# Patient Record
Sex: Male | Born: 1945 | Race: Black or African American | Hispanic: No | Marital: Single | Smoking: Never smoker
Health system: Southern US, Community
[De-identification: ages and names within clinical notes are randomized; demographics above are authoritative.]

## PROBLEM LIST (undated history)

## (undated) DIAGNOSIS — F79 Unspecified intellectual disabilities: Secondary | ICD-10-CM

## (undated) DIAGNOSIS — E119 Type 2 diabetes mellitus without complications: Secondary | ICD-10-CM

## (undated) DIAGNOSIS — I10 Essential (primary) hypertension: Secondary | ICD-10-CM

---

## 2018-07-03 ENCOUNTER — Emergency Department (HOSPITAL_BASED_OUTPATIENT_CLINIC_OR_DEPARTMENT_OTHER): Payer: Medicare Other

## 2018-07-03 ENCOUNTER — Other Ambulatory Visit: Payer: Self-pay

## 2018-07-03 ENCOUNTER — Emergency Department (HOSPITAL_BASED_OUTPATIENT_CLINIC_OR_DEPARTMENT_OTHER)
Admission: EM | Admit: 2018-07-03 | Discharge: 2018-07-03 | Disposition: A | Discharge status: Home / Self Care | Payer: Medicare Other | Attending: Emergency Medicine | Admitting: Emergency Medicine

## 2018-07-03 ENCOUNTER — Encounter (HOSPITAL_BASED_OUTPATIENT_CLINIC_OR_DEPARTMENT_OTHER): Payer: Self-pay

## 2018-07-03 DIAGNOSIS — Y9389 Activity, other specified: Secondary | ICD-10-CM | POA: Diagnosis not present

## 2018-07-03 DIAGNOSIS — E119 Type 2 diabetes mellitus without complications: Secondary | ICD-10-CM | POA: Diagnosis not present

## 2018-07-03 DIAGNOSIS — Y999 Unspecified external cause status: Secondary | ICD-10-CM | POA: Diagnosis not present

## 2018-07-03 DIAGNOSIS — Y92003 Bedroom of unspecified non-institutional (private) residence as the place of occurrence of the external cause: Secondary | ICD-10-CM | POA: Diagnosis not present

## 2018-07-03 DIAGNOSIS — S60222A Contusion of left hand, initial encounter: Secondary | ICD-10-CM | POA: Diagnosis not present

## 2018-07-03 DIAGNOSIS — W06XXXA Fall from bed, initial encounter: Secondary | ICD-10-CM | POA: Diagnosis not present

## 2018-07-03 DIAGNOSIS — I10 Essential (primary) hypertension: Secondary | ICD-10-CM | POA: Insufficient documentation

## 2018-07-03 DIAGNOSIS — W19XXXA Unspecified fall, initial encounter: Secondary | ICD-10-CM

## 2018-07-03 DIAGNOSIS — S022XXA Fracture of nasal bones, initial encounter for closed fracture: Secondary | ICD-10-CM | POA: Diagnosis not present

## 2018-07-03 DIAGNOSIS — S0990XA Unspecified injury of head, initial encounter: Secondary | ICD-10-CM | POA: Diagnosis present

## 2018-07-03 HISTORY — DX: Essential (primary) hypertension: I10

## 2018-07-03 HISTORY — DX: Unspecified intellectual disabilities: F79

## 2018-07-03 HISTORY — DX: Type 2 diabetes mellitus without complications: E11.9

## 2018-07-03 NOTE — ED Provider Notes (Signed)
[facility] EMERGENCY DEPARTMENT Provider Note   CSN: [id number] Arrival date & time: 07/03/18  1220     History   Chief Complaint Chief Complaint  Patient presents with  . Fall    HPI Jermaine Nixon is a 73 y.o. male.     Patient with history of diabetes, hypertension presents the emergency department today after a fall approximately 2 AM.  Patient is ambulatory at baseline.  He lives at home with his sister.  He was getting out of his bed early this morning and family number heard him fall.  He was found to be sitting on his rear end with a nosebleed.  Nosebleed slowly stopped.  No preceding weakness or other recent health issues.  No blood thinners.  Patient has been ambulatory at his baseline prior to arrival today.  He sustained a small abrasion and some swelling over the bridge of the nose.  Also some swelling to his left posterior hand.  No other treatments prior to arrival.  Onset of symptoms acute.  Course is resolved.  Nothing makes symptoms better or worse.     Past Medical History:  Diagnosis Date  . Diabetes mellitus without complication ([place])   . Hypertension   . Mental disability     There are no active problems to display for this patient.   History reviewed. No pertinent surgical history.      Home Medications    Prior to Admission medications   Not on File    Family History No family history on file.  Social History Social History   Tobacco Use  . Smoking status: Never Smoker  . Smokeless tobacco: Never Used  Substance Use Topics  . Alcohol use: Never    Frequency: Never  . Drug use: Never     Allergies   Patient has no known allergies.   Review of Systems Review of Systems  Constitutional: Negative for fatigue.  HENT: Positive for facial swelling and nosebleeds. Negative for tinnitus.   Eyes: Negative for photophobia, pain and visual disturbance.  Respiratory: Negative for shortness of breath.   Cardiovascular: Negative  for chest pain.  Gastrointestinal: Negative for nausea and vomiting.  Musculoskeletal: Negative for back pain, gait problem and neck pain.  Skin: Negative for wound.  Neurological: Negative for dizziness, weakness, light-headedness, numbness and headaches.  Psychiatric/Behavioral: Negative for confusion and decreased concentration.     Physical Exam Updated Vital Signs BP 127/66 (BP Location: Right Arm)   Pulse 94   Temp 97.7 F (36.5 C) (Oral)   Resp 20   SpO2 100%   Physical Exam Vitals signs and nursing note reviewed.  Constitutional:      Appearance: He is well-developed.  HENT:     Head: Normocephalic. No raccoon eyes or Battle's sign.     Comments: There is a very small abrasion over the bridge of the nose with trace swelling over the bridge of the nose.    Right Ear: Tympanic membrane, ear canal and external ear normal. No hemotympanum.     Left Ear: Tympanic membrane, ear canal and external ear normal. No hemotympanum.     Nose: Nose normal.     Mouth/Throat:     Comments: Dentition intact. Eyes:     General: Lids are normal.     Conjunctiva/sclera: Conjunctivae normal.     Pupils: Pupils are equal, round, and reactive to light.     Comments: No visible hyphema  Neck:     Musculoskeletal: Normal range  of motion and neck supple.  Cardiovascular:     Rate and Rhythm: Normal rate and regular rhythm.  Pulmonary:     Effort: Pulmonary effort is normal.     Breath sounds: Normal breath sounds.  Abdominal:     Palpations: Abdomen is soft.     Tenderness: There is no abdominal tenderness.  Musculoskeletal: Normal range of motion.        General: Swelling present. No tenderness.     Cervical back: He exhibits normal range of motion, no tenderness and no bony tenderness.     Thoracic back: He exhibits no tenderness and no bony tenderness.     Lumbar back: He exhibits no tenderness and no bony tenderness.     Left hand: He exhibits swelling. He exhibits normal capillary  refill.       Hands:     Comments: Patient moves upper and lower extremities spontaneously without pain.  No tenderness with palpation over the C-spine.  Skin:    General: Skin is warm and dry.  Neurological:     Mental Status: He is alert.     GCS: GCS eye subscore is 4. GCS verbal subscore is 5. GCS motor subscore is 6.     Cranial Nerves: No cranial nerve deficit.     Sensory: No sensory deficit.     Coordination: Coordination normal.     Deep Tendon Reflexes: Reflexes are normal and symmetric.     Comments: Mental but disability at baseline.  Patient answers with questions yes and no.  Follows instructions well.      ED Treatments / Results  Labs (all labs ordered are listed, but only abnormal results are displayed) Labs Reviewed - No data to display  EKG None  Radiology No results found.  Procedures Procedures (including critical care time)  Medications Ordered in ED Medications - No data to display   Initial Impression / Assessment and Plan / ED Course  I have reviewed the triage vital signs and the nursing notes.  Pertinent labs & imaging results that were available during my care of the patient were reviewed by me and considered in my medical decision making (see chart for details).        Patient seen and examined.  Patient appears well.  No significant neurological decompensation after 11 hours.  Given his age and risk factors with head injury, will image the head, face, and neck.  Will also obtain x-ray of the hand.  Do not feel that a medical work-up is indicated as patient has been in his normal state of health without any unusual behavior weakness prior to fall this morning.  Vital signs reviewed and are as follows: BP 127/66 (BP Location: Right Arm)   Pulse 94   Temp 97.7 F (36.5 C) (Oral)   Resp 20   SpO2 100%   3:18 PM patient discussed with and seen by Dr. Pilar PlateBero.   Patient and family member updated on results.  Rechecked hand.  Patient has no  navicular or snuffbox tenderness.  Discussed conservative measures with OTC meds, ice, elevation for treatment.  Encouraged PCP follow-up if having any difficulty breathing through the nose after swelling around the nose improved.  Family member is comfortable with bringing patient home at this time.   Final Clinical Impressions(s) / ED Diagnoses   Final diagnoses:  Closed fracture of nasal bone, initial encounter  Contusion of left hand, initial encounter  Fall, initial encounter   Patient with injuries after mechanical  fall early this morning.  CT imaging reveals bony nasal bone fractures.  Suspect contusion of the hand without fracture.  No snuffbox tenderness.  Patient is otherwise in his baseline state of health.  No indications for further work-up at this time.  ED Discharge Orders    None       Renne CriglerGeiple, Aalina Brege, Cordelia Poche-C 07/03/18 1519    Sabas SousBero, Michael M, MD 07/05/18 [phone]

## 2018-07-03 NOTE — Discharge Instructions (Signed)
Please read and follow all provided instructions.  Your diagnoses today include:  1. Closed fracture of nasal bone, initial encounter   2. Contusion of left hand, initial encounter   3. Fall, initial encounter     Tests performed today include:  CT scan of your head, face and neck that showed nasal bone fractures but did not show any other serious injury.  X-ray of the hand - no definite broken bones  Vital signs. See below for your results today.   Medications prescribed:   None  Take any prescribed medications only as directed.  Home care instructions:  Follow any educational materials contained in this packet.  BE VERY CAREFUL not to take multiple medicines containing Tylenol (also called acetaminophen). Doing so can lead to an overdose which can damage your liver and cause liver failure and possibly death.   Follow-up instructions: Please follow-up with your primary care provider in the next 3 days for further evaluation of your symptoms if not feeling better.   Return instructions:  SEEK IMMEDIATE MEDICAL ATTENTION IF:  There is confusion or drowsiness (although children frequently become drowsy after injury).   You cannot awaken the injured person.   You have more than one episode of vomiting.   You notice dizziness or unsteadiness which is getting worse, or inability to walk.   You have convulsions or unconsciousness.   You experience severe, persistent headaches not relieved by Tylenol.  You cannot use arms or legs normally.   There are changes in pupil sizes. (This is the black center in the colored part of the eye)   There is clear or bloody discharge from the nose or ears.   You have change in speech, vision, swallowing, or understanding.   Localized weakness, numbness, tingling, or change in bowel or bladder control.  You have any other emergent concerns.  Additional Information: You have had a head injury which does not appear to require  admission at this time.  Your vital signs today were: BP 127/66 (BP Location: Right Arm)    Pulse 94    Temp 97.7 F (36.5 C) (Oral)    Resp 20    SpO2 100%  If your blood pressure (BP) was elevated above 135/85 this visit, please have this repeated by your doctor within one month. --------------

## 2018-07-03 NOTE — ED Triage Notes (Signed)
Per pt's sister pt fell ~2am-she states she heard a bang and found pt by his bed-pt mostly nonverbal-she states he had a nose bleed-unsure of other injuries-pt able to state he has pain to nose and left hand-to triage in w/c-NAD

## 2018-07-03 NOTE — ED Notes (Signed)
Pt to bathroom prior to room

## 2019-10-21 ENCOUNTER — Ambulatory Visit: Payer: Medicare Other | Attending: Internal Medicine

## 2019-10-21 DIAGNOSIS — Z23 Encounter for immunization: Secondary | ICD-10-CM

## 2019-10-21 NOTE — Progress Notes (Signed)
   Covid-19 Vaccination [facility]  Name:  Jermaine Nixon    MRN: [id number] DOB: 12/26/1945  10/21/2019  [name] was observed post Covid-19 immunization for 15 minutes without incident. He was provided with Vaccine Information Sheet and instruction to access the V-Safe system.   Jermaine Nixon was instructed to call 911 with any severe reactions post vaccine: Marland Kitchen Difficulty breathing  . Swelling of face and throat  . A fast heartbeat  . A bad rash all over body  . Dizziness and weakness

## 2020-06-25 ENCOUNTER — Other Ambulatory Visit: Payer: Self-pay

## 2020-06-25 ENCOUNTER — Emergency Department (HOSPITAL_BASED_OUTPATIENT_CLINIC_OR_DEPARTMENT_OTHER): Payer: Medicare Other

## 2020-06-25 ENCOUNTER — Emergency Department (HOSPITAL_BASED_OUTPATIENT_CLINIC_OR_DEPARTMENT_OTHER)
Admission: EM | Admit: 2020-06-25 | Discharge: 2020-06-25 | Disposition: A | Discharge status: Home / Self Care | Payer: Medicare Other | Attending: Emergency Medicine | Admitting: Emergency Medicine

## 2020-06-25 ENCOUNTER — Encounter (HOSPITAL_BASED_OUTPATIENT_CLINIC_OR_DEPARTMENT_OTHER): Payer: Self-pay | Admitting: *Deleted

## 2020-06-25 DIAGNOSIS — E119 Type 2 diabetes mellitus without complications: Secondary | ICD-10-CM | POA: Diagnosis not present

## 2020-06-25 DIAGNOSIS — W19XXXA Unspecified fall, initial encounter: Secondary | ICD-10-CM

## 2020-06-25 DIAGNOSIS — S62114A Nondisplaced fracture of triquetrum [cuneiform] bone, right wrist, initial encounter for closed fracture: Secondary | ICD-10-CM

## 2020-06-25 DIAGNOSIS — M25531 Pain in right wrist: Secondary | ICD-10-CM | POA: Diagnosis not present

## 2020-06-25 DIAGNOSIS — Z79899 Other long term (current) drug therapy: Secondary | ICD-10-CM | POA: Diagnosis not present

## 2020-06-25 DIAGNOSIS — I10 Essential (primary) hypertension: Secondary | ICD-10-CM | POA: Insufficient documentation

## 2020-06-25 DIAGNOSIS — S6991XA Unspecified injury of right wrist, hand and finger(s), initial encounter: Secondary | ICD-10-CM | POA: Diagnosis present

## 2020-06-25 DIAGNOSIS — Z7982 Long term (current) use of aspirin: Secondary | ICD-10-CM | POA: Insufficient documentation

## 2020-06-25 DIAGNOSIS — W06XXXA Fall from bed, initial encounter: Secondary | ICD-10-CM | POA: Insufficient documentation

## 2020-06-25 DIAGNOSIS — Z7984 Long term (current) use of oral hypoglycemic drugs: Secondary | ICD-10-CM | POA: Insufficient documentation

## 2020-06-25 NOTE — Discharge Instructions (Addendum)
Imaging reveals you have a small fracture in one of the bones of your wrist.  I have placed you in a splint please keep on.  Do not get it wet as this is not waterproof.  I recommend over-the-counter pain medications like ibuprofen and or Tylenol every 6 hours as needed please follow dosing on the back of bottle.  I want you to follow-up with hand surgery for further evaluation given the contact information above please call  Come back to the emergency department if you develop chest pain, shortness of breath, severe abdominal pain, uncontrolled nausea, vomiting, diarrhea.

## 2020-06-25 NOTE — ED Provider Notes (Signed)
[facility] EMERGENCY DEPARTMENT Provider Note   CSN: [id number] Arrival date & time: 06/25/20  1734     History Chief Complaint  Patient presents with   Jermaine Nixon is a 75 y.o. male.  HPI  Patient with significant medical history of diabetes, hypertension, mental disability presents with chief complaint of right wrist pain.  Patient states he was in his bed and slid out  falling onto his butt.  He denies hitting his head, losing conscious, is not on anticoagulant, states that he was able to get himself up off the floor, has no pain when he ambulates.  States that he developed some wrist pain earlier today, states that his right wrist hurts when he moves it, denies paresthesia or weakness in his lower extremities.  Patient denies neck pain, back pain, chest pain, abdominal pain, hip pain or leg pain.  Patient's sister was at bedside able to validate the story, states that he is only started complaint of pain today while she was changing him, she states that he has been ambulating without difficulty, has had no other complaints, states he has been acting his normal self.  Past Medical History:  Diagnosis Date   Diabetes mellitus without complication ([place])    Hypertension    Mental disability     There are no problems to display for this patient.   History reviewed. No pertinent surgical history.     No family history on file.  Social History   Tobacco Use   Smoking status: Never   Smokeless tobacco: Never  Vaping Use   Vaping Use: Never used  Substance Use Topics   Alcohol use: Never   Drug use: Never    Home Medications Prior to Admission medications   Medication Sig Start Date End Date Taking? Authorizing Provider  aspirin EC 81 MG tablet Take by mouth. 05/12/10  Yes [provider]  benazepril (LOTENSIN) 20 MG tablet Take by mouth. 05/12/10  Yes [provider]  glipiZIDE (GLUCOTROL XL) 10 MG 24 hr tablet Take by mouth. 05/12/10   Yes [provider]  hydrochlorothiazide (HYDRODIURIL) 25 MG tablet Take by mouth. 05/12/10  Yes [provider]  metFORMIN (GLUCOPHAGE) 500 MG tablet Take by mouth. 05/12/10  Yes [provider]  pioglitazone (ACTOS) 45 MG tablet Take by mouth. 05/12/10  Yes [provider]  Assure Comfort Lancets 28G MISC Frequency:   Dosage:0     Instructions:  Note:check glucose daily 10/22/09   [provider]  Blood Glucose Monitoring Suppl (FIFTY50 GLUCOSE METER 2.0) w/Device KIT Frequency:   Dosage:0     Instructions:  Note:check blood sugar daily 10/22/09   [provider]  glucose blood (PRODIGY NO CODING BLOOD GLUC) test strip Frequency:Daily   Dosage:1     Instructions:  Note:TEST ONCE DAILY. 10/22/09   [provider]    Allergies    Patient has no known allergies.  Review of Systems   Review of Systems  Constitutional:  Negative for chills and fever.  HENT:  Negative for congestion.   Respiratory:  Negative for shortness of breath.   Cardiovascular:  Negative for chest pain.  Gastrointestinal:  Negative for abdominal pain.  Genitourinary:  Negative for enuresis.  Musculoskeletal:  Negative for back pain.       Right wrist pain.  Skin:  Negative for rash.  Neurological:  Negative for dizziness.  Hematological:  Does not bruise/bleed easily.   Physical Exam Updated Vital Signs  BP 136/71 (BP Location: Left Arm)   Pulse 82   Temp (!) 97.5 F (36.4 C) (Oral)   Resp 14   Ht 6' (1.829 m)   Wt 59 kg   SpO2 96%   BMI 17.63 kg/m   Physical Exam Vitals and nursing note reviewed.  Constitutional:      General: He is not in acute distress.    Appearance: He is not ill-appearing.  HENT:     Head: Normocephalic and atraumatic.     Nose: No congestion.  Eyes:     Conjunctiva/sclera: Conjunctivae normal.  Cardiovascular:     Rate and Rhythm: Normal rate and regular rhythm.     Pulses: Normal pulses.     Heart sounds: No  murmur heard.   No friction rub. No gallop.     Comments: Chest was palpated was nontender to palpation. Pulmonary:     Effort: No respiratory distress.     Breath sounds: No wheezing, rhonchi or rales.  Abdominal:     Palpations: Abdomen is soft.     Tenderness: There is no abdominal tenderness. There is no right CVA tenderness or left CVA tenderness.  Musculoskeletal:     Comments: Spine was palpated was nontender to palpation, no step-off or deformities present.  Patient is moving all 4 extremities.  Lower extremities were palpated they are nontender to palpation, there is no noted internal or external rotation of the hips, no leg shortening present.    Patient's right wrist was visualized it was slightly erythematous and edematous on my exam, he was tender to palpation along patient's anatomical snuffbox, no gross deformities present.  Patient is able to move all fingers, wrist, elbow without difficulty.  Neurovascular fully intact.  Skin:    General: Skin is warm and dry.  Neurological:     Mental Status: He is alert.  Psychiatric:        Mood and Affect: Mood normal.    ED Results / Procedures / Treatments   Labs (all labs ordered are listed, but only abnormal results are displayed) Labs Reviewed - No data to display  EKG None  Radiology DG Wrist Complete Right  Result Date: 06/25/2020 CLINICAL DATA:  Right wrist pain after fall yesterday. EXAM: RIGHT WRIST - COMPLETE 3+ VIEW COMPARISON:  None. FINDINGS: Dorsal triquetral fracture with overlying soft tissue swelling. Suspected nondisplaced fracture through the tip of the radial styloid process. No additional fracture. No dislocation. 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 IMPRESSION: 1. Dorsal triquetral fracture. 2. Suspected nondisplaced fracture through the tip of the radial styloid process. Electronically Signed   By: Titus Dubin M.D.   On: 06/25/2020 18:39    Procedures Procedures   Medications Ordered in  ED Medications - No data to display  ED Course  I have reviewed the triage vital signs and the nursing notes.  Pertinent labs & imaging results that were available during my care of the patient were reviewed by me and considered in my medical decision making (see chart for details).    MDM Rules/Calculators/A&P                         Initial impression-patient presents with right wrist pain.  He is alert, does not appear in acute distress, vital signs reassuring.  Will obtain imaging for further evaluation.  Work-up-x-ray reveals dorsal triquetral fracture and possible nondisplaced fracture through the tip of the radial styloid process.  Reassessment-patient and sister updated on imaging,  they are in agreement with splinting and referral to hand surgeon further evaluation.  They have no other questions at this time.  Rule out-low suspicion for intracranial head bleed as patient is not on anticoagulant, he denies hitting his head, denies headaches, change in vision, paresthesias or weakness the upper or lower extremities.  Low suspicion for spinal cord abnormality or spinal fracture as spine was palpated was nontender to palpation, patient is able to move all 4 extremities.  Low suspicion for rib fracture or pneumothorax as lung sounds are clear bilaterally, ribs are nontender to palpation, will defer imaging at this time.  Low suspicion for intra-abdominal trauma as abdomen soft nontender to palpation.  Low suspicion for hip fracture as there is no internal or external rotation of the legs, no leg shortening, patient stable without difficulty.  Plan-  Right wrist pain-suspect pain secondary due to fractures, will place in a sugar-tong splint, recommend over-the-counter pain medications, follow-up with hand surgery for further evaluation.  Vital signs have remained stable, no indication for hospital admission.  Patient discussed with attending and they agreed with assessment and plan.  Patient  given at home care as well strict return precautions.  Patient verbalized that they understood agreed to said plan.   Final Clinical Impression(s) / ED Diagnoses Final diagnoses:  Fall, initial encounter  Closed nondisplaced fracture of triquetrum of right wrist, initial encounter    Rx / DC Orders ED Discharge Orders     None        Aron Baba 06/25/20 Leane Platt, MD 06/25/20 2325

## 2020-06-25 NOTE — ED Triage Notes (Signed)
Yesterday he slipped off the bed landing on his buttocks. Redness to his right wrist. Hx CP.

## 2021-02-09 ENCOUNTER — Encounter (HOSPITAL_BASED_OUTPATIENT_CLINIC_OR_DEPARTMENT_OTHER): Payer: Self-pay

## 2021-02-09 ENCOUNTER — Emergency Department (HOSPITAL_BASED_OUTPATIENT_CLINIC_OR_DEPARTMENT_OTHER): Payer: Medicare Other

## 2021-02-09 ENCOUNTER — Other Ambulatory Visit: Payer: Self-pay

## 2021-02-09 ENCOUNTER — Observation Stay (HOSPITAL_BASED_OUTPATIENT_CLINIC_OR_DEPARTMENT_OTHER)
Admission: EM | Admit: 2021-02-09 | Discharge: 2021-02-11 | Disposition: A | Discharge status: Home Health | Payer: Medicare Other | Attending: Internal Medicine | Admitting: Internal Medicine

## 2021-02-09 DIAGNOSIS — R627 Adult failure to thrive: Secondary | ICD-10-CM | POA: Diagnosis not present

## 2021-02-09 DIAGNOSIS — Z7982 Long term (current) use of aspirin: Secondary | ICD-10-CM | POA: Diagnosis not present

## 2021-02-09 DIAGNOSIS — E870 Hyperosmolality and hypernatremia: Secondary | ICD-10-CM | POA: Diagnosis present

## 2021-02-09 DIAGNOSIS — R531 Weakness: Secondary | ICD-10-CM | POA: Diagnosis present

## 2021-02-09 DIAGNOSIS — Z20822 Contact with and (suspected) exposure to covid-19: Secondary | ICD-10-CM | POA: Diagnosis not present

## 2021-02-09 DIAGNOSIS — E860 Dehydration: Secondary | ICD-10-CM | POA: Diagnosis present

## 2021-02-09 DIAGNOSIS — Z7984 Long term (current) use of oral hypoglycemic drugs: Secondary | ICD-10-CM | POA: Insufficient documentation

## 2021-02-09 DIAGNOSIS — I10 Essential (primary) hypertension: Secondary | ICD-10-CM | POA: Diagnosis present

## 2021-02-09 DIAGNOSIS — N179 Acute kidney failure, unspecified: Secondary | ICD-10-CM | POA: Diagnosis present

## 2021-02-09 DIAGNOSIS — E785 Hyperlipidemia, unspecified: Secondary | ICD-10-CM | POA: Diagnosis present

## 2021-02-09 DIAGNOSIS — E119 Type 2 diabetes mellitus without complications: Secondary | ICD-10-CM | POA: Diagnosis not present

## 2021-02-09 DIAGNOSIS — E1122 Type 2 diabetes mellitus with diabetic chronic kidney disease: Secondary | ICD-10-CM

## 2021-02-09 DIAGNOSIS — Z79899 Other long term (current) drug therapy: Secondary | ICD-10-CM | POA: Diagnosis not present

## 2021-02-09 DIAGNOSIS — E872 Acidosis, unspecified: Principal | ICD-10-CM | POA: Diagnosis present

## 2021-02-09 LAB — COMPREHENSIVE METABOLIC PANEL
ALT: 21 U/L (ref 0–44)
AST: 28 U/L (ref 15–41)
Albumin: 4.5 g/dL (ref 3.5–5.0)
Alkaline Phosphatase: 69 U/L (ref 38–126)
Anion gap: 13 (ref 5–15)
BUN: 55 mg/dL — ABNORMAL HIGH (ref 8–23)
CO2: 26 mmol/L (ref 22–32)
Calcium: 10.7 mg/dL — ABNORMAL HIGH (ref 8.9–10.3)
Chloride: 109 mmol/L (ref 98–111)
Creatinine, Ser: 1.57 mg/dL — ABNORMAL HIGH (ref 0.61–1.24)
GFR, Estimated: 45 mL/min — ABNORMAL LOW (ref >60)
Glucose, Bld: 371 mg/dL — ABNORMAL HIGH (ref 70–99)
Potassium: 4.7 mmol/L (ref 3.5–5.1)
Sodium: 148 mmol/L — ABNORMAL HIGH (ref 135–145)
Total Bilirubin: 0.4 mg/dL (ref 0.3–1.2)
Total Protein: 8.6 g/dL — ABNORMAL HIGH (ref 6.5–8.1)

## 2021-02-09 LAB — CBC WITH DIFFERENTIAL/PLATELET
Abs Immature Granulocytes: 0.01 10*3/uL (ref 0.00–0.07)
Basophils Absolute: 0 10*3/uL (ref 0.0–0.1)
Basophils Relative: 0 %
Eosinophils Absolute: 0 10*3/uL (ref 0.0–0.5)
Eosinophils Relative: 0 %
HCT: 42.6 % (ref 39.0–52.0)
Hemoglobin: 13.5 g/dL (ref 13.0–17.0)
Immature Granulocytes: 0 %
Lymphocytes Relative: 20 %
Lymphs Abs: 1.6 10*3/uL (ref 0.7–4.0)
MCH: 30.5 pg (ref 26.0–34.0)
MCHC: 31.7 g/dL (ref 30.0–36.0)
MCV: 96.4 fL (ref 80.0–100.0)
Monocytes Absolute: 0.3 10*3/uL (ref 0.1–1.0)
Monocytes Relative: 4 %
Neutro Abs: 6 10*3/uL (ref 1.7–7.7)
Neutrophils Relative %: 76 %
Platelets: 248 10*3/uL (ref 150–400)
RBC: 4.42 MIL/uL (ref 4.22–5.81)
RDW: 16 % — ABNORMAL HIGH (ref 11.5–15.5)
WBC: 7.9 10*3/uL (ref 4.0–10.5)
nRBC: 0 % (ref 0.0–0.2)

## 2021-02-09 LAB — URINALYSIS, ROUTINE W REFLEX MICROSCOPIC
Bilirubin Urine: NEGATIVE
Glucose, UA: >=500 mg/dL — AB
Ketones, ur: 15 mg/dL — AB
Leukocytes,Ua: NEGATIVE
Nitrite: NEGATIVE
Protein, ur: NEGATIVE mg/dL
Specific Gravity, Urine: 1.01 (ref 1.005–1.030)
pH: 5 (ref 5.0–8.0)

## 2021-02-09 LAB — RESP PANEL BY RT-PCR (FLU A&B, COVID) ARPGX2
Influenza A by PCR: NEGATIVE
Influenza B by PCR: NEGATIVE
SARS Coronavirus 2 by RT PCR: NEGATIVE

## 2021-02-09 LAB — CBG MONITORING, ED: Glucose-Capillary: 125 mg/dL — ABNORMAL HIGH (ref 70–99)

## 2021-02-09 LAB — BASIC METABOLIC PANEL
Anion gap: 8 (ref 5–15)
BUN: 38 mg/dL — ABNORMAL HIGH (ref 8–23)
CO2: 26 mmol/L (ref 22–32)
Calcium: 9.4 mg/dL (ref 8.9–10.3)
Chloride: 113 mmol/L — ABNORMAL HIGH (ref 98–111)
Creatinine, Ser: 1.08 mg/dL (ref 0.61–1.24)
GFR, Estimated: >60 mL/min (ref >60)
Glucose, Bld: 127 mg/dL — ABNORMAL HIGH (ref 70–99)
Potassium: 3.6 mmol/L (ref 3.5–5.1)
Sodium: 147 mmol/L — ABNORMAL HIGH (ref 135–145)

## 2021-02-09 LAB — LACTIC ACID, PLASMA
Lactic Acid, Venous: 5.7 mmol/L (ref 0.5–1.9)
Lactic Acid, Venous: 4.8 mmol/L (ref 0.5–1.9)
Lactic Acid, Venous: 2 mmol/L (ref 0.5–1.9)

## 2021-02-09 LAB — SALICYLATE LEVEL: Salicylate Lvl: <7 mg/dL — ABNORMAL LOW (ref 7.0–30.0)

## 2021-02-09 LAB — URINALYSIS, MICROSCOPIC (REFLEX): WBC, UA: NONE SEEN WBC/hpf (ref 0–5)

## 2021-02-09 LAB — TSH: TSH: 1.81 u[IU]/mL (ref 0.350–4.500)

## 2021-02-09 LAB — CK: Total CK: 102 U/L (ref 49–397)

## 2021-02-09 LAB — AMMONIA: Ammonia: 26 umol/L (ref 9–35)

## 2021-02-09 LAB — GLUCOSE, CAPILLARY: Glucose-Capillary: 117 mg/dL — ABNORMAL HIGH (ref 70–99)

## 2021-02-09 MED ORDER — VANCOMYCIN HCL IN DEXTROSE 1-5 GM/200ML-% IV SOLN: 1000.0000 mg | INTRAVENOUS | Status: DC

## 2021-02-09 MED ORDER — PHENOL 1.4 % MT LIQD: 1.0000 | OROMUCOSAL | Status: DC | PRN

## 2021-02-09 MED ORDER — SALINE SPRAY 0.65 % NA SOLN: 1.0000 | NASAL | Status: DC | PRN

## 2021-02-09 MED ORDER — SODIUM CHLORIDE 0.9 % IV SOLN
2.0000 g | Freq: Once | INTRAVENOUS | Status: AC
  Administered 2021-02-09: 2 g via INTRAVENOUS
  Filled 2021-02-09: qty 2

## 2021-02-09 MED ORDER — FLEET ENEMA 7-19 GM/118ML RE ENEM
1.0000 | ENEMA | Freq: Once | RECTAL | Status: AC
  Administered 2021-02-10: 1 via RECTAL
  Filled 2021-02-09 (×2): qty 1

## 2021-02-09 MED ORDER — ALUM & MAG HYDROXIDE-SIMETH 200-200-20 MG/5ML PO SUSP: 30.0000 mL | ORAL | Status: DC | PRN

## 2021-02-09 MED ORDER — VANCOMYCIN HCL IN DEXTROSE 1-5 GM/200ML-% IV SOLN
1000.0000 mg | Freq: Once | INTRAVENOUS | Status: AC
  Administered 2021-02-09: 1000 mg via INTRAVENOUS
  Filled 2021-02-09: qty 200

## 2021-02-09 MED ORDER — POLYVINYL ALCOHOL 1.4 % OP SOLN: 1.0000 [drp] | OPHTHALMIC | Status: DC | PRN

## 2021-02-09 MED ORDER — METRONIDAZOLE 500 MG/100ML IV SOLN
500.0000 mg | Freq: Once | INTRAVENOUS | Status: AC
  Administered 2021-02-09: 500 mg via INTRAVENOUS
  Filled 2021-02-09: qty 100

## 2021-02-09 MED ORDER — SODIUM CHLORIDE 0.9 % IV SOLN: 2.0000 g | INTRAVENOUS | Status: DC

## 2021-02-09 MED ORDER — IOHEXOL 300 MG/ML  SOLN
80.0000 mL | Freq: Once | INTRAMUSCULAR | Status: AC | PRN
  Administered 2021-02-09: 80 mL via INTRAVENOUS

## 2021-02-09 MED ORDER — MUSCLE RUB 10-15 % EX CREA: 1.0000 "application " | TOPICAL_CREAM | CUTANEOUS | Status: DC | PRN

## 2021-02-09 MED ORDER — LACTATED RINGERS IV SOLN: INTRAVENOUS | Status: AC

## 2021-02-09 MED ORDER — ACETAMINOPHEN 650 MG RE SUPP: 650.0000 mg | Freq: Four times a day (QID) | RECTAL | Status: DC | PRN

## 2021-02-09 MED ORDER — INSULIN ASPART 100 UNIT/ML IJ SOLN
0.0000 [IU] | Freq: Three times a day (TID) | INTRAMUSCULAR | Status: DC
  Administered 2021-02-10: 2 [IU] via SUBCUTANEOUS
  Administered 2021-02-10: 1 [IU] via SUBCUTANEOUS
  Administered 2021-02-11: 2 [IU] via SUBCUTANEOUS

## 2021-02-09 MED ORDER — HYDROCORTISONE (PERIANAL) 2.5 % EX CREA: 1.0000 "application " | TOPICAL_CREAM | Freq: Four times a day (QID) | CUTANEOUS | Status: DC | PRN

## 2021-02-09 MED ORDER — LORATADINE 10 MG PO TABS: 10.0000 mg | ORAL_TABLET | Freq: Every day | ORAL | Status: DC | PRN

## 2021-02-09 MED ORDER — ACETAMINOPHEN 325 MG PO TABS: 650.0000 mg | ORAL_TABLET | Freq: Four times a day (QID) | ORAL | Status: DC | PRN

## 2021-02-09 MED ORDER — SODIUM CHLORIDE 0.9 % IV BOLUS
1000.0000 mL | Freq: Once | INTRAVENOUS | Status: AC
Start: 2021-02-09 — End: 2021-02-09
  Administered 2021-02-09: 1000 mL via INTRAVENOUS

## 2021-02-09 MED ORDER — LACTATED RINGERS IV BOLUS (SEPSIS)
1000.0000 mL | Freq: Once | INTRAVENOUS | Status: AC
  Administered 2021-02-09: 1000 mL via INTRAVENOUS

## 2021-02-09 MED ORDER — LIP MEDEX EX OINT: 1.0000 "application " | TOPICAL_OINTMENT | CUTANEOUS | Status: DC | PRN

## 2021-02-09 MED ORDER — GUAIFENESIN-DM 100-10 MG/5ML PO SYRP: 5.0000 mL | ORAL_SOLUTION | ORAL | Status: DC | PRN

## 2021-02-09 MED ORDER — HYDROCORTISONE 1 % EX CREA: 1.0000 "application " | TOPICAL_CREAM | Freq: Three times a day (TID) | CUTANEOUS | Status: DC | PRN

## 2021-02-09 NOTE — Sepsis Progress Note (Signed)
[facility] tracking the Code Sepsis. 

## 2021-02-09 NOTE — ED Notes (Signed)
Report given to [facility] 

## 2021-02-09 NOTE — ED Provider Notes (Signed)
[facility] EMERGENCY DEPARTMENT Provider Note   CSN: [id number] Arrival date & time: 02/09/21  1052     History  Chief Complaint  Patient presents with   Weakness    Jermaine Nixon is a 76 y.o. male.  76  yo M with a chief complaint of persistent dry mouth weakness going on for about a week now.  Patient was seen by his PCP and started on multiple medications.  Had 4 new medications started this week.  Jardiance, statin something for his urinary retention.   Weakness     Home Medications Prior to Admission medications   Medication Sig Start Date End Date Taking? Authorizing Provider  aspirin EC 81 MG tablet Take by mouth. 05/12/10   [provider]  Assure Comfort Lancets 28G MISC Frequency:   Dosage:0     Instructions:  Note:check glucose daily 10/22/09   [provider]  atorvastatin (LIPITOR) 40 MG tablet Take 40 mg by mouth daily. 01/28/21   [provider]  benazepril (LOTENSIN) 20 MG tablet Take by mouth. 05/12/10   [provider]  Blood Glucose Monitoring Suppl (FIFTY50 GLUCOSE METER 2.0) w/Device KIT Frequency:   Dosage:0     Instructions:  Note:check blood sugar daily 10/22/09   [provider]  glucose blood (PRODIGY NO CODING BLOOD GLUC) test strip Frequency:Daily   Dosage:1     Instructions:  Note:TEST ONCE DAILY. 10/22/09   [provider]  hydrochlorothiazide (HYDRODIURIL) 25 MG tablet Take by mouth. 05/12/10   [provider]  JARDIANCE 10 MG TABS tablet Take 10 mg by mouth daily. 01/28/21   [provider]  metFORMIN (GLUCOPHAGE) 500 MG tablet Take by mouth. 05/12/10   [provider]  pioglitazone (ACTOS) 45 MG tablet Take by mouth. 05/12/10   [provider]      Allergies    Patient has no known allergies.    Review of Systems   Review of Systems  Neurological:  Positive for weakness.   Physical Exam Updated Vital Signs BP 139/86    Pulse (!) 125    Temp 97.9  F (36.6 C)    Resp (!) 21    Ht 6' (1.829 m)    Wt 59.1 kg    SpO2 100%    BMI 17.66 kg/m  Physical Exam Vitals and nursing note reviewed.  Constitutional:      Appearance: He is well-developed.  HENT:     Head: Normocephalic and atraumatic.  Eyes:     Pupils: Pupils are equal, round, and reactive to light.  Neck:     Vascular: No JVD.  Cardiovascular:     Rate and Rhythm: Normal rate and regular rhythm.     Heart sounds: No murmur heard.   No friction rub. No gallop.  Pulmonary:     Effort: No respiratory distress.     Breath sounds: No wheezing.  Abdominal:     General: There is no distension.     Tenderness: There is no abdominal tenderness. There is no guarding or rebound.  Musculoskeletal:        General: Normal range of motion.     Cervical back: Normal range of motion and neck supple.  Skin:    Coloration: Skin is not pale.     Findings: No rash.  Neurological:     Mental Status: He is alert and oriented to person, place, and time.  Psychiatric:        Behavior: Behavior normal.  ED Results / Procedures / Treatments   Labs (all labs ordered are listed, but only abnormal results are displayed) Labs Reviewed  COMPREHENSIVE METABOLIC PANEL - Abnormal; Notable for the following components:      Result Value   Sodium 148 (*)    Glucose, Bld 371 (*)    BUN 55 (*)    Creatinine, Ser 1.57 (*)    Calcium 10.7 (*)    Total Protein 8.6 (*)    GFR, Estimated 45 (*)    All other components within normal limits  LACTIC ACID, PLASMA - Abnormal; Notable for the following components:   Lactic Acid, Venous 5.7 (*)    All other components within normal limits  CBC WITH DIFFERENTIAL/PLATELET - Abnormal; Notable for the following components:   RDW 16.0 (*)    All other components within normal limits  URINALYSIS, ROUTINE W REFLEX MICROSCOPIC - Abnormal; Notable for the following components:   Glucose, UA >=500 (*)    Hgb urine dipstick TRACE (*)    Ketones, ur 15 (*)     All other components within normal limits  URINALYSIS, MICROSCOPIC (REFLEX) - Abnormal; Notable for the following components:   Bacteria, UA RARE (*)    All other components within normal limits  CULTURE, BLOOD (ROUTINE X 2)  CULTURE, BLOOD (ROUTINE X 2)  RESP PANEL BY RT-PCR (FLU A&B, COVID) ARPGX2  AMMONIA  LACTIC ACID, PLASMA  LACTIC ACID, PLASMA  CBG MONITORING, ED    EKG EKG Interpretation  Date/Time:  Wednesday February 09 2021 11:56:59 EST Ventricular Rate:  102 PR Interval:  152 QRS Duration: 124 QT Interval:  343 QTC Calculation: 447 R Axis:   -51 Text Interpretation: Sinus tachycardia Right atrial enlargement Nonspecific IVCD with LAD Abnrm T, consider ischemia, anterolateral lds No old tracing to compare Confirmed by Deno Etienne [phone]) on 02/09/2021 12:06:46 PM  Radiology DG Chest 2 View  Result Date: 02/09/2021 CLINICAL DATA:  Altered level of consciousness. EXAM: CHEST - 2 VIEW COMPARISON:  None. FINDINGS: Heart size and vascularity normal. Lungs clear without infiltrate or effusion. No acute skeletal abnormality. IMPRESSION: No active cardiopulmonary disease. Electronically Signed   By: Franchot Gallo M.D.   On: 02/09/2021 12:04   CT Head Wo Contrast  Result Date: 02/09/2021 CLINICAL DATA:  Delirium. EXAM: CT HEAD WITHOUT CONTRAST TECHNIQUE: Contiguous axial images were obtained from the base of the skull through the vertex without intravenous contrast. RADIATION DOSE REDUCTION: This exam was performed according to the departmental dose-optimization program which includes automated exposure control, adjustment of the mA and/or kV according to patient size and/or use of iterative reconstruction technique. COMPARISON:  Prior head CT 07/03/2018. FINDINGS: Brain: Mildly motion degraded exam. Mild generalized cerebral atrophy.  Moderate cerebellar atrophy. 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 Background moderate patchy and ill-defined hypoattenuation within the cerebral white matter, nonspecific but compatible with chronic small vessel ischemic disease. Redemonstrated small chronic infarct within the inferior right cerebellar hemisphere. There is no acute intracranial hemorrhage. No demarcated cortical infarct. No extra-axial fluid collection. No evidence of an intracranial mass. No midline shift. Vascular: No hyperdense vessel. Atherosclerotic calcifications. Skull: Normal. Negative for fracture or focal lesion. Sinuses/Orbits: Visualized orbits show no acute finding. Mild mucosal thickening within the right maxillary sinus at the imaged levels. Mild mucosal thickening, and small mucous retention cyst, within the left maxillary sinus at the imaged levels. IMPRESSION: 1. Mildly motion degraded exam. 2. No evidence  of acute intracranial abnormality. 3. 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 4. Background moderate chronic small vessel ischemic changes within the cerebral white matter. 5. Redemonstrated small chronic infarct within the inferior right cerebellar hemisphere. 6. Mild generalized cerebral atrophy. 7. Moderate cerebellar atrophy. 8. Paranasal sinus disease at the imaged levels, as described. Electronically Signed   By: Kellie Simmering D.O.   On: 02/09/2021 14:52   CT ABDOMEN PELVIS W CONTRAST  Result Date: 02/09/2021 CLINICAL DATA:  Sepsis EXAM: CT ABDOMEN AND PELVIS WITH CONTRAST TECHNIQUE: Multidetector CT imaging of the abdomen and pelvis was performed using the standard protocol following bolus administration of intravenous contrast. RADIATION DOSE REDUCTION: This exam was performed according to the departmental dose-optimization program which includes automated exposure control, adjustment of the mA and/or kV according to patient size and/or use of iterative reconstruction technique. CONTRAST:   60m OMNIPAQUE IOHEXOL 300 MG/ML  SOLN COMPARISON:  None. FINDINGS: Lower chest: Basilar atelectasis.  Coronary artery calcifications. Hepatobiliary: No focal liver abnormality is seen. No gallstones, gallbladder wall thickening, or biliary dilatation. 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 No surrounding inflammatory change. No main pancreatic duct dilation. Spleen: Normal in size without focal abnormality. Adrenals/Urinary Tract: Bilateral adrenal glands are unremarkable. No hydronephrosis or nephrolithiasis. Scarring of the upper pole of the left kidney. Bladder is unremarkable. Stomach/Bowel: Stomach is within normal limits. Appendix is not visualized due to crowding of the bowel loops related to paucity of intra-fat. No secondary findings of acute appendicitis. No evidence of bowel wall thickening, distention, or inflammatory changes. Rectal stool ball measuring 6.5 cm with rectal wall thickening. Vascular/Lymphatic: Aortic atherosclerosis. No enlarged abdominal or pelvic lymph nodes. Reproductive: Mild prostatomegaly. Other: No intra-abdominal ascites or free air. No intra-abdominal fluid collections. Musculoskeletal: No acute or significant osseous findings. IMPRESSION: 1. No findings in the abdomen or pelvis to explain sepsis. 2. Rectal stool ball with no rectal wall thickening. 3.  Aortic Atherosclerosis (ICD10-I70.0). Electronically Signed   By: LYetta GlassmanM.D.   On: 02/09/2021 14:49    Procedures Procedures    Medications Ordered in ED Medications  lactated ringers infusion (has no administration in time range)  ceFEPIme (MAXIPIME) 2 g in sodium chloride 0.9 % 100 mL IVPB (2 g Intravenous New Bag/Given 02/09/21 1507)  metroNIDAZOLE (FLAGYL) IVPB 500 mg (500 mg Intravenous New Bag/Given 02/09/21 1514)  vancomycin (VANCOCIN) IVPB 1000 mg/200 mL premix (1,000 mg Intravenous New Bag/Given 02/09/21 1450)  lactated ringers bolus 1,000 mL (1,000 mLs  Intravenous New Bag/Given 02/09/21 1443)  ceFEPIme (MAXIPIME) 2 g in sodium chloride 0.9 % 100 mL IVPB (has no administration in time range)  vancomycin (VANCOCIN) IVPB 1000 mg/200 mL premix (has no administration in time range)  sodium phosphate (FLEET) 7-19 GM/118ML enema 1 enema (has no administration in time range)  sodium chloride 0.9 % bolus 1,000 mL (0 mLs Intravenous Stopped 02/09/21 1256)  iohexol (OMNIPAQUE) 300 MG/ML solution 80 mL (80 mLs Intravenous Contrast Given 02/09/21 1424)    ED Course/ Medical Decision Making/ A&P                           Medical Decision Making Amount and/or Complexity of Data Reviewed Labs: ordered. Radiology: ordered.  Risk OTC drugs. Prescription drug management. Decision regarding hospitalization.   Patient is a 76y.o. male with a cc of ams.  Going on for the past  couple days to a week.  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  Tells me that he had 4 different medications changed at their last doctors visit about a week ago.  Since then has developed a dry mouth and has been a bit more weak than normal.  On my review of the records the most recent visit I can see he was mostly bedbound.  The sister tells me he is much better since then.  Has been able to ambulate short distances usually with assistance.  Obtain a laboratory evaluation and chest x-ray blood work bolus of IV fluids UA bladder scan reassess..   I independently interpreted the patients labs and imaging chest x-ray independently interpreted by me without focal infiltrate or pneumothorax.  His lactic acid level is significantly elevated at 5.7.  UA interpreted by me without obvious infection.  Ammonia levels normal.  CT scan of the head negative.  CT of the abdomen pelvis without obvious acute pathology.  I reviewed the patient's recent medication changes and drug database and do not see lactic acidosis is a common finding for these.  Patient  could be dehydrated.  He was given 30 cc/kg of IV fluids and was given antibiotics for possible infection as history is somewhat limited.  Discussed with the hospitalist for admission.  CRITICAL CARE Performed by: Cecilio Asper   Total critical care time: 35 minutes  Critical care time was exclusive of separately billable procedures and treating other patients.  Critical care was necessary to treat or prevent imminent or life-threatening deterioration.  Critical care was time spent personally by me on the following activities: development of treatment plan with patient and/or surrogate as well as nursing, discussions with consultants, evaluation of patient's response to treatment, examination of patient, obtaining history from patient or surrogate, ordering and performing treatments and interventions, ordering and review of laboratory studies, ordering and review of radiographic studies, pulse oximetry and re-evaluation of patient's condition.  The patients results and plan were reviewed and discussed.   Any x-rays performed were independently reviewed by myself.   Differential diagnosis were considered with the presenting HPI.  Medications  lactated ringers infusion (has no administration in time range)  ceFEPIme (MAXIPIME) 2 g in sodium chloride 0.9 % 100 mL IVPB (2 g Intravenous New Bag/Given 02/09/21 1507)  metroNIDAZOLE (FLAGYL) IVPB 500 mg (500 mg Intravenous New Bag/Given 02/09/21 1514)  vancomycin (VANCOCIN) IVPB 1000 mg/200 mL premix (1,000 mg Intravenous New Bag/Given 02/09/21 1450)  lactated ringers bolus 1,000 mL (1,000 mLs Intravenous New Bag/Given 02/09/21 1443)  ceFEPIme (MAXIPIME) 2 g in sodium chloride 0.9 % 100 mL IVPB (has no administration in time range)  vancomycin (VANCOCIN) IVPB 1000 mg/200 mL premix (has no administration in time range)  sodium phosphate (FLEET) 7-19 GM/118ML enema 1 enema (has no administration in time range)  sodium chloride 0.9 % bolus 1,000 mL  (0 mLs Intravenous Stopped 02/09/21 1256)  iohexol (OMNIPAQUE) 300 MG/ML solution 80 mL (80 mLs Intravenous Contrast Given 02/09/21 1424)    Vitals:   02/09/21 1300 02/09/21 1400 02/09/21 1454 02/09/21 1500  BP: 134/71 131/68  139/86  Pulse: 96 (!) 101  (!) 125  Resp: 18 16  (!) 21  Temp:      SpO2: 100% 100%  100%  Weight:   59.1 kg   Height:   6' (1.829 m)     Final diagnoses:  Lactic acidemia  Weakness  Admission/ observation were discussed with the admitting physician, patient and/or family and they are comfortable with the plan.           Final Clinical Impression(s) / ED Diagnoses Final diagnoses:  Lactic acidemia  Weakness    Rx / DC Orders ED Discharge Orders     None         Deno Etienne, DO 02/09/21 1531

## 2021-02-09 NOTE — Sepsis Progress Note (Signed)
Notified bedside nurse of need to administer antibiotics, finish IV Fluid Resuscitation and collect a repeat Lactate after the IVF bolus is complete.

## 2021-02-09 NOTE — Progress Notes (Signed)
Date and time results received: 02/09/21 2208 (use smartphrase ".now" to insert current time)  Test: Lactic Acid Critical Value: 2.0   Name of Provider Notified: Howerter, DO  Orders Received? Or Actions Taken?:  Awaiting response.

## 2021-02-09 NOTE — ED Triage Notes (Signed)
Sister brought pt in to evaluated for weakness and mouth irritation. Started on atorvatatin, jardience and medication for bladder control . Pt seen by PCP 01/25/21

## 2021-02-09 NOTE — Progress Notes (Signed)
Pt arrived to [id number]. Patient vitals and skin swarm completed with [place], [place]. No skin issues noted. Pt resting in bed with bed alarm on. Awaiting pt's sister's arrival.

## 2021-02-09 NOTE — Progress Notes (Signed)
Pharmacy Antibiotic Note  Jasun Gasparini is a 76 y.o. male admitted on 02/09/2021 presenting with generalized weakness, concern for sepsis.  Pharmacy has been consulted for vancomycin and cefepime dosing.  Plan: Vancomycin 1g IV q 36h (eAUC 533, Goal AUC 400-550, SCr 1.57) Cefepime 2g IV q 24h Monitor renal function, Cx and clinical progression to narrow Vancomycin levels as indicated  Weight: 56.7 kg (125 lb)  Temp (24hrs), Avg:97.9 F (36.6 C), Min:97.9 F (36.6 C), Max:97.9 F (36.6 C)  Recent Labs  Lab 02/09/21 1132 02/09/21 1329  WBC 7.9  --   CREATININE 1.57*  --   LATICACIDVEN  --  5.7*    CrCl cannot be calculated (Unknown ideal weight.).    No Known Allergies  Daylene Posey, PharmD Clinical Pharmacist ED Pharmacist Phone # [phone] 02/09/2021 2:21 PM

## 2021-02-09 NOTE — Progress Notes (Signed)
When attempting to give the patient sips of water, the patient had 3 episodes of choking. This RN decided to hold off on further food and drinks, until the patient could be adequately assessed.

## 2021-02-09 NOTE — H&P (Signed)
History and Physical    PLEASE NOTE THAT DRAGON DICTATION SOFTWARE WAS USED IN THE CONSTRUCTION OF THIS NOTE.   Jermaine Nixon GDJ:[id number] DOB: [birth year] DOA: 02/09/2021  PCP: Saintclair Halsted, FNP  Patient coming from: home   I have personally briefly reviewed patient's old medical records in [place]  Chief Complaint: Generalized weakness  HPI: Jermaine Nixon is a 76 y.o. male with medical history significant for cognitive delay, type 2 diabetes mellitus, essential retention, hyperlipidemia, who is admitted to [facility] on 02/09/2021 by way of transfer from [place] emergency department with lactic acidosis after presenting from home to the latter facility for evaluation of generalized weakness.   In the setting of the patient's underlying cognitive delay and associated limited ability to contribute to the history, the following history is obtained via the EDP and via chart review.  The patient reports 2 to 3 days of generalized weakness in the absence of any associated acute focal weakness, acute focal numbness, paresthesias, facial droop, slurred speech, expressive aphasia, acute change in vision, dysphagia, vertigo.  Denies any associated subjective fever, chills, rigors, or generalized myalgias.Denies dysuria, gross hematuria, or change in urinary urgency/frequency.  Denies any recent chest pain, diaphoresis, palpitations, shortness of breath, cough, abdominal pain.  Per chart review, there is documentation of multiple recently initiated medications on an outpatient basis by patient's PCP, although the specific nature of these recently initiated medications is not entirely clear to me at this time.  Per chart review, patient has a history of type 2 diabetes mellitus, on multiple oral hypoglycemic agents as an outpatient, in the absence of any exogenous insulin.  He also has a history of essential hypertension, for which she appears to be on benazepril and  HCTZ.  Additionally, per chart review, most recent prior serum creatinine data point appears to be 1.30 in January 2019.     [facility] ED Course:  Vital signs in the ED were notable for the following: Afebrile; initial heart rate 109, which decreased to 88 following interval administration of IV fluids, as further detailed below; blood pressure 121/60 - 139/86; respiratory rate 15-21, oxygen saturation 97 to 100% on room air.  Labs were notable for the following: CMP was notable for the following: Serum sodium of 148, which corrects to approximately 152 and taking into account concomitant hyperglycemia, potassium 4.7, bicarbonate 26, anion gap 13, creatinine 1.57, BUN to creatinine ratio 35, glucose 371, calcium 10.7, albumin 4.5, additional liver enzymes found to be within normal limits.  CBC notable for episode count 7900.  Initial lactate 5.7, with repeat value trending down to 4.8.  Urinalysis was notable for no white blood cells, leukocyte Estrace/nitrate negative, no red blood cells, and demonstrated 15 ketones.  COVID-19/influenza PCR negative, blood cultures x2 were collected prior to initiation of IV antibiotics.  Imaging and additional notable ED work-up: EKG, interpretation of which was limited by significant artifact, but within these confines, appears to demonstrate sinus tachycardia with heart rate 102, no evidence of T wave changes, and nonspecific less than 1 mm ST depression, limited to V4, without any evidence of ST elevation.  Chest x-ray showed no evidence of acute cardiopulmonary process, including no evidence of infiltrate, edema, effusion, or pneumothorax.  CT abdomen/pelvis showed no evidence of acute intra-abdominal/intrapelvic process.  CT head showed no evidence of acute intracranial process, including no evidence of intracranial hemorrhage.  While in the ED, the following were administered: Cefepime, Flagyl, IV vancomycin,  normal saline x1 L bolus, lactated  Ringer's x1 L bolus, followed by initiation of continuous LR at 150 cc/h.  Subsequently, the patient was transferred to [facility] for overnight observation to med telemetry for further evaluation and management of presenting lactic acidosis in setting of suspected dehydration, with corresponding hypernatremia, acute kidney injury, and hypercalcemia, after presenting to [place] for evaluation of generalized weakness.      Review of Systems: As per HPI otherwise 10 point review of systems negative.   Past Medical History:  Diagnosis Date   Diabetes mellitus without complication ([place])    Hypertension    Mental disability     History reviewed. No pertinent surgical history.  Social History:  reports that he has never smoked. He has never used smokeless tobacco. He reports that he does not drink alcohol and does not use drugs.   No Known Allergies  History reviewed. No pertinent family history.   Prior to Admission medications   Medication Sig Start Date End Date Taking? Authorizing Provider  aspirin EC 81 MG tablet Take by mouth. 05/12/10   [provider]  Assure Comfort Lancets 28G MISC Frequency:   Dosage:0     Instructions:  Note:check glucose daily 10/22/09   [provider]  atorvastatin (LIPITOR) 40 MG tablet Take 40 mg by mouth daily. 01/28/21   [provider]  benazepril (LOTENSIN) 20 MG tablet Take by mouth. 05/12/10   [provider]  Blood Glucose Monitoring Suppl (FIFTY50 GLUCOSE METER 2.0) w/Device KIT Frequency:   Dosage:0     Instructions:  Note:check blood sugar daily 10/22/09   [provider]  glucose blood (PRODIGY NO CODING BLOOD GLUC) test strip Frequency:Daily   Dosage:1     Instructions:  Note:TEST ONCE DAILY. 10/22/09   [provider]  hydrochlorothiazide (HYDRODIURIL) 25 MG tablet Take by mouth. 05/12/10   [provider]  JARDIANCE 10 MG TABS tablet Take 10 mg by mouth  daily. 01/28/21   [provider]  metFORMIN (GLUCOPHAGE) 500 MG tablet Take by mouth. 05/12/10   [provider]  pioglitazone (ACTOS) 45 MG tablet Take by mouth. 05/12/10   [provider]     Objective    Physical Exam: Vitals:   02/09/21 1730 02/09/21 1734 02/09/21 1800 02/09/21 1930  BP: 134/76  131/70 133/85  Pulse:   88 88  Resp: '17  12 16  ' Temp:  98.1 F (36.7 C)  (!) 97.4 F (36.3 C)  TempSrc:  Rectal  Oral  SpO2: 100%  100% 97%  Weight:      Height:        General: appears to be stated age; alert, oriented Skin: warm, dry, no rash Head:  AT/[place] Mouth:  Oral mucosa membranes appear dry, normal dentition Neck: supple; trachea midline Heart:  RRR; did not appreciate any M/R/G Lungs: CTAB, did not appreciate any wheezes, rales, or rhonchi Abdomen: + BS; soft, ND, NT Vascular: 2+ pedal pulses b/l; 2+ radial pulses b/l Extremities: no peripheral edema, no muscle wasting Neuro: strength and sensation intact in upper and lower extremities b/l    Labs on Admission: I have personally reviewed following labs and imaging studies  CBC: Recent Labs  Lab 02/09/21 1132  WBC 7.9  NEUTROABS 6.0  HGB 13.5  HCT 42.6  MCV 96.4  PLT 585   Basic Metabolic Panel: Recent Labs  Lab 02/09/21 1132  NA 148*  K 4.7  CL 109  CO2 26  GLUCOSE 371*  BUN 55*  CREATININE 1.57*  CALCIUM 10.7*   GFR: Estimated Creatinine Clearance: 32.9 mL/min (A) (by C-G formula based on SCr of 1.57 mg/dL (H)). Liver Function Tests: Recent Labs  Lab 02/09/21 1132  AST 28  ALT 21  ALKPHOS 69  BILITOT 0.4  PROT 8.6*  ALBUMIN 4.5   No results for input(s): LIPASE, AMYLASE in the last 168 hours. Recent Labs  Lab 02/09/21 1329  AMMONIA 26   Coagulation Profile: No results for input(s): INR, PROTIME in the last 168 hours. Cardiac Enzymes: No results for input(s): CKTOTAL, CKMB, CKMBINDEX, TROPONINI in the last 168 hours. BNP (last 3 results) No results for  input(s): PROBNP in the last 8760 hours. HbA1C: No results for input(s): HGBA1C in the last 72 hours. CBG: Recent Labs  Lab 02/09/21 1719 02/09/21 2004  GLUCAP 125* 117*   Lipid Profile: No results for input(s): CHOL, HDL, LDLCALC, TRIG, CHOLHDL, LDLDIRECT in the last 72 hours. Thyroid Function Tests: No results for input(s): TSH, T4TOTAL, FREET4, T3FREE, THYROIDAB in the last 72 hours. Anemia Panel: No results for input(s): VITAMINB12, FOLATE, FERRITIN, TIBC, IRON, RETICCTPCT in the last 72 hours. Urine analysis:    Component Value Date/Time   COLORURINE YELLOW 02/09/2021 [place] 02/09/2021 1243   LABSPEC 1.010 02/09/2021 1243   PHURINE 5.0 02/09/2021 1243   GLUCOSEU >=500 (A) 02/09/2021 1243   HGBUR TRACE (A) 02/09/2021 1243   BILIRUBINUR NEGATIVE 02/09/2021 1243   KETONESUR 15 (A) 02/09/2021 1243   PROTEINUR NEGATIVE 02/09/2021 1243   NITRITE NEGATIVE 02/09/2021 1243   LEUKOCYTESUR NEGATIVE 02/09/2021 1243    Radiological Exams on Admission: DG Chest 2 View  Result Date: 02/09/2021 CLINICAL DATA:  Altered level of consciousness. EXAM: CHEST - 2 VIEW COMPARISON:  None. FINDINGS: Heart size and vascularity normal. Lungs clear without infiltrate or effusion. No acute skeletal abnormality. IMPRESSION: No active cardiopulmonary disease. Electronically Signed   By: Franchot Gallo M.D.   On: 02/09/2021 12:04   CT Head Wo Contrast  Result Date: 02/09/2021 CLINICAL DATA:  Delirium. EXAM: CT HEAD WITHOUT CONTRAST TECHNIQUE: Contiguous axial images were obtained from the base of the skull through the vertex without intravenous contrast. RADIATION DOSE REDUCTION: This exam was performed according to the departmental dose-optimization program which includes automated exposure control, adjustment of the mA and/or kV according to patient size and/or use of iterative reconstruction technique. COMPARISON:  Prior head CT 07/03/2018. FINDINGS: Brain: Mildly motion degraded  exam. Mild generalized cerebral atrophy.  Moderate cerebellar atrophy. 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 Background moderate patchy and ill-defined hypoattenuation within the cerebral white matter, nonspecific but compatible with chronic small vessel ischemic disease. Redemonstrated small chronic infarct within the inferior right cerebellar hemisphere. There is no acute intracranial hemorrhage. No demarcated cortical infarct. No extra-axial fluid collection. No evidence of an intracranial mass. No midline shift. Vascular: No hyperdense vessel. Atherosclerotic calcifications. Skull: Normal. Negative for fracture or focal lesion. Sinuses/Orbits: Visualized orbits show no acute finding. Mild mucosal thickening within the right maxillary sinus at the imaged levels. Mild mucosal thickening, and small mucous retention cyst, within the left maxillary sinus at the imaged levels. IMPRESSION: 1. Mildly motion degraded exam. 2. No evidence of acute intracranial abnormality. 3. 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 4. Background moderate chronic small vessel ischemic changes within the cerebral white matter. 5. Redemonstrated small  chronic infarct within the inferior right cerebellar hemisphere. 6. Mild generalized cerebral atrophy. 7. Moderate cerebellar atrophy. 8. Paranasal sinus disease at the imaged levels, as described. Electronically Signed   By: Kellie Simmering D.O.   On: 02/09/2021 14:52   CT ABDOMEN PELVIS W CONTRAST  Result Date: 02/09/2021 CLINICAL DATA:  Sepsis EXAM: CT ABDOMEN AND PELVIS WITH CONTRAST TECHNIQUE: Multidetector CT imaging of the abdomen and pelvis was performed using the standard protocol following bolus administration of intravenous contrast. RADIATION DOSE REDUCTION: This exam was performed according to the departmental  dose-optimization program which includes automated exposure control, adjustment of the mA and/or kV according to patient size and/or use of iterative reconstruction technique. CONTRAST:  31m OMNIPAQUE IOHEXOL 300 MG/ML  SOLN COMPARISON:  None. FINDINGS: Lower chest: Basilar atelectasis.  Coronary artery calcifications. Hepatobiliary: No focal liver abnormality is seen. No gallstones, gallbladder wall thickening, or biliary dilatation. 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 No surrounding inflammatory change. No main pancreatic duct dilation. Spleen: Normal in size without focal abnormality. Adrenals/Urinary Tract: Bilateral adrenal glands are unremarkable. No hydronephrosis or nephrolithiasis. Scarring of the upper pole of the left kidney. Bladder is unremarkable. Stomach/Bowel: Stomach is within normal limits. Appendix is not visualized due to crowding of the bowel loops related to paucity of intra-fat. No secondary findings of acute appendicitis. No evidence of bowel wall thickening, distention, or inflammatory changes. Rectal stool ball measuring 6.5 cm with rectal wall thickening. Vascular/Lymphatic: Aortic atherosclerosis. No enlarged abdominal or pelvic lymph nodes. Reproductive: Mild prostatomegaly. Other: No intra-abdominal ascites or free air. No intra-abdominal fluid collections. Musculoskeletal: No acute or significant osseous findings. IMPRESSION: 1. No findings in the abdomen or pelvis to explain sepsis. 2. Rectal stool ball with no rectal wall thickening. 3.  Aortic Atherosclerosis (ICD10-I70.0). Electronically Signed   By: LYetta GlassmanM.D.   On: 02/09/2021 14:49     EKG: Independently reviewed, with result as described above.    Assessment/Plan    Principal Problem:   Lactic acidosis Active Problems:   Dehydration   Hypernatremia   AKI (acute kidney injury) ([place]   Hypercalcemia   Generalized weakness   Hypertension   HLD  (hyperlipidemia)   DM2 (diabetes mellitus, type 2) (HCC)     #) Lactic Acidosis: Initial lactic acid level of 5.7, with repeat value trending down to 4.8 following interval IV fluids, as further quantified above.  Suspect that this is multifactorial in nature, with contributions from dehydration, potential acute kidney injury given interval increase in serum creatinine level, as further detailed below, starvation keto lactic acidosis given the presence of 15 ketones on urinalysis, with differential also including the possibility of type B lactic acidosis in the setting of metformin and interval increase in serum creatinine level, with possibility that this metformin represents a recently initiated medication as an outpatient.  Of note, in the absence of objective fever or leukocytosis, SIRS criteria not currently met.  Additionally, no evidence of underlying infection at this time, including urinalysis inconsistent with UTI, chest x-ray demonstrating no evidence of acute cardiopulmonary process, and negative COVID-19/influenza PCR, in addition to CT abdomen/pelvis showing no evidence of acute intra-abdominal/intra pelvic process.  Consequently, criteria for sepsis is not currently met, and elevated lactate is not felt to be on the basis of underlying infectious process at this time.  Will blood sugar is mildly elevated, there is no evidence of associated anion gap metabolic acidosis to suggest DKA.  As patient is on high  intensity atorvastatin as an outpatient we will also check CPK level, and check INR to evaluate hepatic synthetic function.  Of note, given the degree of initial elevation of serum lactic acid level, CT head was performed, but demonstrated no evidence of acute intracranial process, including no evidence of intracranial hemorrhage.   Plan: CMP/CBC in the AM. Monitor strict I's & O's. Check salicylate level. Check INR.  Reduce rate of continuous LR slightly from 150 to 125 cc/h.  Continue to  trend serum lactate.  Add on CPK level.  Check urinary drug screen.  Discontinue good sepsis as well as existing IV antibiotic orders in the absence of any evidence of underlying infection at this time.  Hold home metformin.  Further evaluation and management of presenting elevated serum creatinine level, as below.        #) Hypovolemic hypernatremia: Initial serum sodium level 140, which corrects to approximately 152 when taking into account concomitant initial hyperglycemia, as above. differential at this time appears to favor free water deficiency as a consequence of dehydration resultant from decrease in recent free water intake . Will further evaluate via urine studies, as further detailed below, and provide continuous IV fluids overnight, but at reduced rate, as above, with plan for reevaluating serum sodium level in the a.m. for determination of need for conversion of IV fluids to D5 water to further correct serum sodium level depended upon ensuing trend.  Denies any lithium use.  No overt evidence of associated acute focal neurologic deficits.     Plan: Add on random urine sodium, random urine creatinine, urine osmolality, serum osmolality.  Monitor strict I's and O's and daily weights.  Repeat BMP at 2300 for monitoring of interval trend in serum sodium.  Repeat CMP in the morning.  Reduce LR to 125 cc/h, as above.           #) Dehydration: Clinical suspicion for such, including the appearance of dry oral mucous membranes as well as laboratory findings notable for acute prerenal azotemia.  Suspect this to be in the setting of   decrease in oral intake, which also appears to be consistent with suspected starvation keto lactic acidosis, as above.  No e/o associated hypotension.  Resolution of mild tachycardia with IV fluids also appears to be consistent with presenting dehydration.   Plan: Monitor strict I's and O's.  Daily weights.  Repeat BMP in the morning. IVF's, as above.          #) Elevated serum creatinine: Presenting serum creatinine 1.57, relative to most recent prior value of 1.30 in January 2019.  Suspect that this is consistent with acute kidney injury in the setting of presenting dehydration, as above, although absence of interval data points over the last 4 years complicates determination of chronicity of this elevation.  Presenting urinalysis with microscopy, with results as described above.  Potential pharmacologic contributions in form of outpatient benazepril as well as HCTZ, with the latter further complicating a picture of suspected dehydration.   Plan: Continuous LR, as above.  Monitor strict I's and O's and weights.  Attempt avoid nephrotoxic agents.  Hold home benazepril as well as HCTZ.  Repeat BMP in the morning.  Add on random urine sodium as well as random urine creatinine.  Add on CPK level.         #) Hypercalcemia: Presenting labs reflect serum calcium 10.7.  Suspect element of dehydration, with potential pharmacologic contribution from home HCTZ. Will continue IVF's, as above, with repeat calcium  level in the morning, with consideration for further expansion of work-up if no ensuing improvement in serum calcium level following interval IVF administration.     Plan: LR, as above.  Monitor strict I's&O's, daily weights.  CMP in the morning.  Ionized calcium.  Check serum Mg and Phos levels.  Hold home HCTZ.        #) Generalized weakness: 2 to 3-day duration of generalized weakness, in the absence of any evidence of acute focal neurologic deficits, including no evidence of acute focal weakness, with CT head showing no evidence of acute intracranial process. Suspect that this is multifactorial in nature, with contribution from physiologic stress stemming from presenting dehydration, along with associated lactic acidosis, acute kidney injury, hypercalcemia, as above.  No e/o additional infectious process at this time, as further  described above.  In the setting of presenting lactic acidosis and outpatient atorvastatin, also check CPK level.     Plan: work-up and management of presenting dehydration, lactic acidosis, hypercalcemia, as described above. PT/OT consults ordered for the AM. Fall precautions. Check TSH, MMA. CMP/CBC in the AM.  Check ionized calcium, CPK.  IV fluids, as above.  Repeat lactate in the AM.         #) Type 2 Diabetes Mellitus: documented history of such. Home insulin regimen: None. Home oral hypoglycemic agents: Appears to consist of Jardiance, metformin, Actos. presenting blood sugar: Was noted to be 371, in the absence of any anion gap metabolic acidosis to suggest DKA.  Interval improvement of blood sugar to 117 following interval IV fluids, as above.  Concern for potential element of type B lactic acidosis, as described above.   Plan: accuchecks QAC and HS with low dose SSI.  Hold home oral hypoglycemic agents during this hospitalization.  Further evaluation management presenting lactic acidosis, as above         #) Essential Hypertension: documented h/o such, with outpatient antihypertensive regimen including benazepril, HCTZ.  SBP's in the ED today: Normotensive.  In the setting of AKI and hypercalcemia, will hold home benazepril and HCTZ for now  Plan: Close monitoring of subsequent BP via routine VS. hold home benazepril and HCTZ, as above        #) Hyperlipidemia: documented h/o such. On high intensity atorvastatin as outpatient.  In the setting of presenting lactic acidosis, with CPK level pending, will hold home statin for now.   Plan: Hold home statin for now, as above.  Check CPK level.         DVT prophylaxis: SCD's   Code Status: Full code Family Communication: none Disposition Plan: Per Rounding Team Consults called: none;  Admission status: Observation; med telemetry   PLEASE NOTE THAT DRAGON DICTATION SOFTWARE WAS USED IN THE CONSTRUCTION OF  THIS NOTE.   [place] DO [facility] [place]   02/09/2021, 8:17 PM

## 2021-02-10 DIAGNOSIS — E872 Acidosis, unspecified: Secondary | ICD-10-CM | POA: Diagnosis not present

## 2021-02-10 LAB — COMPREHENSIVE METABOLIC PANEL
ALT: 17 U/L (ref 0–44)
AST: 22 U/L (ref 15–41)
Albumin: 3.5 g/dL (ref 3.5–5.0)
Alkaline Phosphatase: 48 U/L (ref 38–126)
Anion gap: 11 (ref 5–15)
BUN: 32 mg/dL — ABNORMAL HIGH (ref 8–23)
CO2: 24 mmol/L (ref 22–32)
Calcium: 9.5 mg/dL (ref 8.9–10.3)
Chloride: 111 mmol/L (ref 98–111)
Creatinine, Ser: 1.1 mg/dL (ref 0.61–1.24)
GFR, Estimated: >60 mL/min (ref >60)
Glucose, Bld: 130 mg/dL — ABNORMAL HIGH (ref 70–99)
Potassium: 4 mmol/L (ref 3.5–5.1)
Sodium: 146 mmol/L — ABNORMAL HIGH (ref 135–145)
Total Bilirubin: 0.4 mg/dL (ref 0.3–1.2)
Total Protein: 6.5 g/dL (ref 6.5–8.1)

## 2021-02-10 LAB — GLUCOSE, CAPILLARY
Glucose-Capillary: 126 mg/dL — ABNORMAL HIGH (ref 70–99)
Glucose-Capillary: 93 mg/dL (ref 70–99)
Glucose-Capillary: 154 mg/dL — ABNORMAL HIGH (ref 70–99)
Glucose-Capillary: 157 mg/dL — ABNORMAL HIGH (ref 70–99)

## 2021-02-10 LAB — CBC WITH DIFFERENTIAL/PLATELET
Abs Immature Granulocytes: 0.03 10*3/uL (ref 0.00–0.07)
Basophils Absolute: 0 10*3/uL (ref 0.0–0.1)
Basophils Relative: 1 %
Eosinophils Absolute: 0.1 10*3/uL (ref 0.0–0.5)
Eosinophils Relative: 1 %
HCT: 38.6 % — ABNORMAL LOW (ref 39.0–52.0)
Hemoglobin: 12.1 g/dL — ABNORMAL LOW (ref 13.0–17.0)
Immature Granulocytes: 0 %
Lymphocytes Relative: 29 %
Lymphs Abs: 2.2 10*3/uL (ref 0.7–4.0)
MCH: 30.9 pg (ref 26.0–34.0)
MCHC: 31.3 g/dL (ref 30.0–36.0)
MCV: 98.5 fL (ref 80.0–100.0)
Monocytes Absolute: 0.4 10*3/uL (ref 0.1–1.0)
Monocytes Relative: 5 %
Neutro Abs: 4.8 10*3/uL (ref 1.7–7.7)
Neutrophils Relative %: 64 %
Platelets: 151 10*3/uL (ref 150–400)
RBC: 3.92 MIL/uL — ABNORMAL LOW (ref 4.22–5.81)
RDW: 15.9 % — ABNORMAL HIGH (ref 11.5–15.5)
WBC: 7.5 10*3/uL (ref 4.0–10.5)
nRBC: 0 % (ref 0.0–0.2)

## 2021-02-10 LAB — OSMOLALITY: Osmolality: 321 mOsm/kg (ref 275–295)

## 2021-02-10 LAB — RAPID URINE DRUG SCREEN, HOSP PERFORMED
Amphetamines: NOT DETECTED
Barbiturates: NOT DETECTED
Benzodiazepines: NOT DETECTED
Cocaine: NOT DETECTED
Opiates: NOT DETECTED
Tetrahydrocannabinol: NOT DETECTED

## 2021-02-10 LAB — MAGNESIUM: Magnesium: 1.9 mg/dL (ref 1.7–2.4)

## 2021-02-10 LAB — PROTIME-INR
INR: 1 (ref 0.8–1.2)
Prothrombin Time: 13.3 seconds (ref 11.4–15.2)

## 2021-02-10 LAB — CREATININE, URINE, RANDOM: Creatinine, Urine: 36.96 mg/dL

## 2021-02-10 LAB — HEMOGLOBIN A1C
Hgb A1c MFr Bld: 9.1 % — ABNORMAL HIGH (ref 4.8–5.6)
Mean Plasma Glucose: 214.47 mg/dL

## 2021-02-10 LAB — LACTIC ACID, PLASMA: Lactic Acid, Venous: 1.9 mmol/L (ref 0.5–1.9)

## 2021-02-10 LAB — PHOSPHORUS: Phosphorus: 3.4 mg/dL (ref 2.5–4.6)

## 2021-02-10 LAB — SODIUM, URINE, RANDOM: Sodium, Ur: 144 mmol/L

## 2021-02-10 LAB — OSMOLALITY, URINE: Osmolality, Ur: 650 mOsm/kg (ref 300–900)

## 2021-02-10 MED ORDER — PIOGLITAZONE HCL 15 MG PO TABS
45.0000 mg | ORAL_TABLET | Freq: Every day | ORAL | Status: DC
  Administered 2021-02-10 – 2021-02-11 (×2): 45 mg via ORAL
  Filled 2021-02-10 (×2): qty 3
  Filled 2021-02-10: qty 1

## 2021-02-10 MED ORDER — ATORVASTATIN CALCIUM 40 MG PO TABS
40.0000 mg | ORAL_TABLET | Freq: Every day | ORAL | Status: DC
  Administered 2021-02-10 – 2021-02-11 (×2): 40 mg via ORAL
  Filled 2021-02-10 (×3): qty 1

## 2021-02-10 MED ORDER — ASPIRIN EC 81 MG PO TBEC: 81.0000 mg | DELAYED_RELEASE_TABLET | Freq: Every day | ORAL | Status: DC | PRN

## 2021-02-10 MED ORDER — SODIUM CHLORIDE 0.9 % IV SOLN: INTRAVENOUS | Status: AC

## 2021-02-10 NOTE — Hospital Course (Signed)
H/o cognitive delay from home, non insulin dependent type 2 diabetes mellitus, brought to  [place] emergency department due to generalized weakness , frequent urination  , shakiness at home , found to have lactic acidosis , he was started on antibiotic sent to [facility] for admission

## 2021-02-10 NOTE — Evaluation (Signed)
Clinical/Bedside Swallow Evaluation Patient Details  Name: Jermaine Nixon MRN: [id number] Date of Birth: 07-23-1945  Today's Date: 02/10/2021 Time: SLP Start Time (ACUTE ONLY): 0910 SLP Stop Time (ACUTE ONLY): 0955 SLP Time Calculation (min) (ACUTE ONLY): 45 min  Past Medical History:  Past Medical History:  Diagnosis Date   Diabetes mellitus without complication (HCC)    Hypertension    Mental disability    Past Surgical History: History reviewed. No pertinent surgical history. HPI:  76 yo male adm to [facility] with AMS, weakness, oral irritation, recent h/o weight loss.  CXR negative.  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  Swallow eval ordered as pt did not pass Yale swallow screen and was overtly coughing with intake - food and drink.  Pt denies dysphagia - but has dysphagia in his medical hx.    Assessment / Plan / Recommendation  Clinical Impression  Patient presents with clinical indications of mild oral c/b minimal anterior labial loss on right and ? cricopharyngeus dysfunction c/b audible swallow.  He appears to have motor planning deficits - resulting in impaired ability to protrude tongue and follow some other oral motor exam directions.   Pt self fed (fully upright) water via cup/straw x 4 styrofoam cups of water.  No clinical indication of aspiration with all po intake with pt self feeding and being fully upright.  Pt advised he eats soft foods prior to admit via yes/no.  Given his dysarthria - dys3 diet advised.  Start intake with liquids due to his reported xerostomia. Pt must self feed and be fully upright for all intake.  Medication administration via applesauce - whole - start and follow with liquids advised.  RN and NT informed and swallow precaution signs posted. SLP Visit Diagnosis: Dysphagia, unspecified (R13.10)    Aspiration Risk  Mild aspiration risk     Diet Recommendation Dysphagia 3 (Mech soft);Thin liquid   Liquid Administration via: Straw;Cup Compensations: Small sips/bites;Slow rate Postural Changes: Seated upright at 90 degrees;Remain upright for at least 30 minutes after po intake    Other  Recommendations Oral Care Recommendations: Oral care BID    Recommendations for follow up therapy are one component of a multi-disciplinary discharge planning process, led by the attending physician.  Recommendations may be updated based on patient status, additional functional criteria and insurance authorization.  Follow up Recommendations Other (comment) (TBD)      Assistance Recommended at Discharge Frequent or constant Supervision/Assistance  Functional Status Assessment Patient has had a recent decline in their functional status and demonstrates the ability to make significant improvements in function in a reasonable and predictable amount of time.  Frequency and Duration min 1 x/week  1 week       Prognosis Prognosis for Safe Diet Advancement: Good      Swallow Study   General Date of Onset: 02/10/21 HPI: 76 yo male adm to [facility] with AMS, weakness, oral irritation, recent h/o weight loss.  CXR negative.  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  Swallow eval ordered as pt did not pass Yale swallow screen and was overtly coughing with intake - food and drink.  Pt denies dysphagia - but has dysphagia in his medical hx. Type of Study: Bedside Swallow Evaluation Previous Swallow Assessment: did not locate prior evaluations in xray Diet Prior to  this Study: NPO Temperature Spikes Noted: No Respiratory Status: Room air History of Recent Intubation: No Behavior/Cognition: Alert;Cooperative;Impulsive;Distractible;Requires cueing (pt is hungry and consumes po quickly) Oral Care Completed by SLP: No Oral Cavity -  Dentition: Adequate natural dentition Vision: Functional for self-feeding Self-Feeding Abilities: Able to feed self Patient Positioning: Upright in bed Baseline Vocal Quality: Low vocal intensity Volitional Cough: Cognitively unable to elicit Volitional Swallow: Able to elicit    Oral/Motor/Sensory Function Overall Oral Motor/Sensory Function: Other (comment) (pt able to seal lips on straw/spoon, no overt facial asymmetry, motor planning deficits noted while pt trying to conduct oral motor exam, did not protrude tongue)   Ice Chips Ice chips: Within functional limits Presentation: Spoon   Thin Liquid Thin Liquid: Impaired Presentation: Self Fed;Cup;Straw;Spoon Oral Phase Impairments: Reduced labial seal Oral Phase Functional Implications: Right anterior spillage Other Comments: audible swallow noted    Nectar Thick Nectar Thick Liquid: Within functional limits Presentation: Cup;Self Fed   Honey Thick Honey Thick Liquid: Not tested   Puree Puree: Within functional limits Presentation: Self Fed;Spoon   Solid     Solid: Impaired Presentation: Self Fed Oral Phase Impairments: Reduced lingual movement/coordination Other Comments: adequate mastication without oral retention - however pt consumes po at rapid rate, discoordination suspected as pt is dysarthric      Chales Abrahams 02/10/2021,10:19 AM  Rolena Infante, MS [facility] SLP Acute Rehab Services Office [phone] Cell [phone]

## 2021-02-10 NOTE — TOC Initial Note (Signed)
Transition of Care [facility]) - Initial/Assessment Note    Patient Details  Name: Jermaine Nixon MRN: [id number] Date of Birth: Jun 21, 1945  Transition of Care [facility]) CM/SW Contact:    Golda Acre, RN Phone Number: 02/10/2021, 9:05 AM  Clinical Narrative:                  Transition of Care [facility]) Screening Note   Patient Details  Name: Jermaine Nixon Date of Birth: [birth year]   Transition of Care [facility]) CM/SW Contact:    Golda Acre, RN Phone Number: 02/10/2021, 9:05 AM    Transition of Care [facility]) has reviewed patient and no TOC needs have been identified at this time. We will continue to monitor patient advancement through interdisciplinary progression rounds. If new patient transition needs arise, please place a TOC consult.    Expected Discharge Plan: Home/Self Care Barriers to Discharge: Continued Medical Work up   Patient Goals and CMS Choice Patient states their goals for this hospitalization and ongoing recovery are:: not stated      Expected Discharge Plan and Services Expected Discharge Plan: Home/Self Care   Discharge Planning Services: CM Consult   Living arrangements for the past 2 months: Single Family Home                                      Prior Living Arrangements/Services Living arrangements for the past 2 months: Single Family Home Lives with:: Self (has a home sitter)   Do you feel safe going back to the place where you live?: Yes      Need for Family Participation in Patient Care: Yes (Comment) Care giver support system in place?: Yes (comment)      Activities of Daily Living Home Assistive Devices/Equipment: Wheelchair ADL Screening (condition at time of admission) Patient's cognitive ability adequate to safely complete daily activities?: No Is the patient deaf or have difficulty hearing?: No Does the patient have difficulty seeing, even when wearing glasses/contacts?: No Does the patient have difficulty  concentrating, remembering, or making decisions?: Yes Patient able to express need for assistance with ADLs?: No Does the patient have difficulty dressing or bathing?: Yes Independently performs ADLs?: No Communication: Needs assistance Is this a change from baseline?: Pre-admission baseline Dressing (OT): Needs assistance Is this a change from baseline?: Pre-admission baseline Grooming: Needs assistance Is this a change from baseline?: Pre-admission baseline Feeding: Needs assistance Is this a change from baseline?: Pre-admission baseline Bathing: Needs assistance Is this a change from baseline?: Pre-admission baseline Toileting: Needs assistance Is this a change from baseline?: Pre-admission baseline In/Out Bed: Needs assistance Is this a change from baseline?: Pre-admission baseline Walks in Home: Needs assistance Is this a change from baseline?: Pre-admission baseline Does the patient have difficulty walking or climbing stairs?: Yes Weakness of Legs: Both Weakness of Arms/Hands: None  Permission Sought/Granted                  Emotional Assessment Appearance:: Appears stated age     Orientation: : Oriented to Self, Oriented to Place, Oriented to  Time, Oriented to Situation Alcohol / Substance Use: Not Applicable Psych Involvement: No (comment)  Admission diagnosis:  Dehydration [E86.0] Weakness [R53.1] Lactic acidemia [E87.20] Patient Active Problem List   Diagnosis Date Noted   Dehydration 02/09/2021   Lactic acidosis 02/09/2021   Hypernatremia 02/09/2021   AKI (acute kidney injury) (HCC) 02/09/2021   Hypercalcemia 02/09/2021  Generalized weakness 02/09/2021   Hypertension    HLD (hyperlipidemia)    DM2 (diabetes mellitus, type 2) (HCC)    PCP:  [name], FNP Pharmacy:   [facility] [phone] - [place], [place] - [address] AT [facility] [address] [place] [address] Phone: [phone] Fax: [phone]     Social  Determinants of Health (SDOH) Interventions    Readmission Risk Interventions No flowsheet data found.

## 2021-02-10 NOTE — Progress Notes (Signed)
PROGRESS NOTE    Jermaine Nixon  HBZ:[id number] DOB: 1945/02/26 DOA: 02/09/2021 PCP: [name], FNP     Brief Narrative:  H/o cognitive delay from home, non insulin dependent type 2 diabetes mellitus, brought to  [facility] emergency department due to generalized weakness , frequent urination  , shakiness at home , found to have lactic acidosis , he was started on antibiotic sent to [facility] for admission    Subjective:  Has baseline cognitive impairment, not able to provide history Sister at bedside help provide history  Patient recently started on jardiance, started not able to control his urine, 'he pees a lot", then he was shaky , sister brought him   Assessment & Plan:   Principal Problem:   Lactic acidosis Active Problems:   Dehydration   Hypernatremia   AKI (acute kidney injury) (HCC)   Hypercalcemia   Generalized weakness   Hypertension   HLD (hyperlipidemia)   DM2 (diabetes mellitus, type 2) (HCC)   Dehydration/hyponatremia/AKI,  -Suspect dehydration due to uncontrolled diabetes /hyperglycemia with taking Jardiance and HCTZ , blood glucose elevated at 3 71 on presentation -Improved on hydration, continue -Hold jardiance and HCTZ due to dehydration  Lactic acidosis -Does not appear to have infection, hold off on antibiotic for now -Improved on hydration, hold metformin  HTN Currently hehydration , hold home bp med  NIDDM2, uncontrolled, Presents with hyperglycemia -A1c 9 Suspect patient has elevated blood glucose at home, recommend sister check blood glucose 3-4 times a day, bring to PMD for further adjust diabetes medication Currently on SSI   FTT, get physical therapy, sister request home health    Nutritional Assessment: The patients BMI is: Body mass index is 17.66 kg/m.Marland Kitchen Seen by dietician.  I agree with the assessment and plan as outlined below: Nutrition Status:          I ordered the following labs:  Unresulted Labs (From  admission, onward)     Start     Ordered   02/10/21 0500  Calcium, ionized  Tomorrow morning,   R        02/09/21 2013   02/09/21 2104  Rapid urine drug screen (hospital performed)  Add-on,   AD        02/09/21 2103   02/09/21 2015  Methylmalonic acid, serum  Add-on,   AD       Question:  Specimen collection method  Answer:  Lab=Lab collect   02/09/21 2014   02/09/21 2013  Creatinine, urine, random  Add-on,   AD        02/09/21 2012   02/09/21 2013  Sodium, urine, random  Add-on,   AD        02/09/21 2012              DVT prophylaxis: SCDs Start: 02/09/21 1944   Code Status:   Code Status: Full Code  Family Communication: Sister at bedside Disposition:   Status is: Observation  Dispo: The patient is from: Home              Anticipated d/c is to: Home health              Anticipated d/c date is: Likely tomorrow on 02/10  Antimicrobials:    Anti-infectives (From admission, onward)    Start     Dose/Rate Route Frequency Ordered Stop   02/11/21 0500  vancomycin (VANCOCIN) IVPB 1000 mg/200 mL premix  Status:  Discontinued  1,000 mg 200 mL/hr over 60 Minutes Intravenous Every 36 hours 02/09/21 1429 02/09/21 2016   02/10/21 1500  ceFEPIme (MAXIPIME) 2 g in sodium chloride 0.9 % 100 mL IVPB  Status:  Discontinued        2 g 200 mL/hr over 30 Minutes Intravenous Every 24 hours 02/09/21 1429 02/09/21 2016   02/09/21 1415  ceFEPIme (MAXIPIME) 2 g in sodium chloride 0.9 % 100 mL IVPB        2 g 200 mL/hr over 30 Minutes Intravenous  Once 02/09/21 1407 02/09/21 1541   02/09/21 1415  metroNIDAZOLE (FLAGYL) IVPB 500 mg        500 mg 100 mL/hr over 60 Minutes Intravenous  Once 02/09/21 1407 02/09/21 1625   02/09/21 1415  vancomycin (VANCOCIN) IVPB 1000 mg/200 mL premix        1,000 mg 200 mL/hr over 60 Minutes Intravenous  Once 02/09/21 1407 02/09/21 1618          Objective: Vitals:   02/10/21 0700 02/10/21 0800 02/10/21 0825 02/10/21 1539  BP:   138/88 113/73   Pulse:   72 73  Resp: 19 20  18   Temp:   (!) 97.5 F (36.4 C) 97.7 F (36.5 C)  TempSrc:   Axillary Oral  SpO2:    98%  Weight:      Height:        Intake/Output Summary (Last 24 hours) at 02/10/2021 1807 Last data filed at 02/10/2021 1306 Gross per 24 hour  Intake 60 ml  Output 500 ml  Net -440 ml   Filed Weights   02/09/21 1124 02/09/21 1454  Weight: 56.7 kg 59.1 kg    Examination:  General exam: alert, awake, communicative,calm, NAD, does has cognitive decline oriented to person, sister report this is his baseline Respiratory system: Clear to auscultation. Respiratory effort normal. Cardiovascular system:  RRR.  Gastrointestinal system: Abdomen is nondistended, soft and nontender.  Normal bowel sounds heard. Central nervous system: Alert and oriented to person, no focal neurological deficits. Extremities:  no edema Skin: No rashes, lesions or ulcers Psychiatry: Calm and cooperative   Data Reviewed: I have personally reviewed  labs and visualized  imaging studies since the last encounter and formulate the plan        Scheduled Meds:  atorvastatin  40 mg Oral Daily   insulin aspart  0-9 Units Subcutaneous TID WC   pioglitazone  45 mg Oral Daily   Continuous Infusions:  sodium chloride 75 mL/hr at 02/10/21 1306     LOS: 0 days     04/10/21, MD PhD FACP [facility]  Available via Epic secure chat 7am-7pm for nonurgent issues Please page for urgent issues To page the attending provider between 7A-7P or the covering provider during after hours 7P-7A, please log into the web site [email or web address] and access using universal [place] password for that web site. If you do not have the password, please call the hospital operator.    02/10/2021, 6:07 PM

## 2021-02-10 NOTE — Evaluation (Signed)
Occupational Therapy Evaluation Patient Details Name: Jermaine Nixon MRN: [id number] DOB: August 27, 1945 Today's Date: 02/10/2021   History of Present Illness Patient is 76 y.o. male admitted to [facility] with AMS, weakness, oral irritation, recent h/o weight loss.  CXR negative.  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   Clinical Impression   [name] is a  76 year old man who presents with generalized weakness, decreased activity tolerance and impaired balance. From a functional stand point patient appears to be close to his baseline - needing significant assistance for ADLs though he can assist but his functional mobility has declined. Patient will benefit from OT services at home in his familiar environment at discharge but currently does not require acute OT services.      Recommendations for follow up therapy are one component of a multi-disciplinary discharge planning process, led by the attending physician.  Recommendations may be updated based on patient status, additional functional criteria and insurance authorization.   Follow Up Recommendations  Home health OT    Assistance Recommended at Discharge Frequent or constant Supervision/Assistance  Patient can return home with the following A little help with walking and/or transfers;A lot of help with bathing/dressing/bathroom;Direct supervision/assist for financial management;Direct supervision/assist for medications management    Functional Status Assessment  Patient has had a recent decline in their functional status and demonstrates the ability to make significant improvements in function in a reasonable and predictable amount of time.  Equipment Recommendations  None recommended by OT    Recommendations for Other Services       Precautions / Restrictions Precautions Precautions: Fall Restrictions Weight  Bearing Restrictions: No      Mobility Bed Mobility Overal bed mobility: Needs Assistance Bed Mobility: Supine to Sit     Supine to sit: Min assist     General bed mobility comments: Min assist to pivot hips with bed pad and scoot to EOB. pt initiated reaching for bed rail and bringing LE's off EOB.    Transfers Overall transfer level: Needs assistance Equipment used: Rolling walker (2 wheels) Transfers: Sit to/from Stand Sit to Stand: Mod assist, +2 safety/equipment           General transfer comment: Mod +2 for safety with rise from EOB. pt completed 2x from EOB and demonstrated safe reach back to recliner at end of gait.      Balance Overall balance assessment: Needs assistance Sitting-balance support: Feet supported Sitting balance-Leahy Scale: Fair     Standing balance support: Reliant on assistive device for balance, During functional activity, Bilateral upper extremity supported, Single extremity supported Standing balance-Leahy Scale: Poor                             ADL either performed or assessed with clinical judgement   ADL Overall ADL's : At baseline                                       General ADL Comments: Appears to be near his baseline. His sister is seen feeding him but he can hold a cup and feed himself bread when asked. He needed assistance to don LB clothing but attempts to pull up pants. Sister reports he typically gets up and goes to the bathroom but he  also wears a depends. She helps him with wiping. Patient using an external catheter here in hospital.     Vision   Vision Assessment?: No apparent visual deficits     Perception     Praxis      Pertinent Vitals/Pain Pain Assessment Pain Assessment: No/denies pain     Hand Dominance Right   Extremity/Trunk Assessment Upper Extremity Assessment Upper Extremity Assessment: Overall WFL for tasks assessed   Lower Extremity Assessment Lower Extremity  Assessment: Defer to PT evaluation   Cervical / Trunk Assessment Cervical / Trunk Assessment: Kyphotic   Communication Communication Communication: No difficulties   Cognition Arousal/Alertness: Awake/alert Behavior During Therapy: Flat affect Overall Cognitive Status: History of cognitive impairments - at baseline                                       General Comments       Exercises     Shoulder Instructions      Home Living Family/patient expects to be discharged to:: Private residence Living Arrangements: Other relatives Available Help at Discharge: Family Type of Home: House Home Access: Stairs to enter Secretary/administrator of Steps: 1 Entrance Stairs-Rails: None Home Layout: Two level;Able to live on main level with bedroom/bathroom;Full bath on main level Alternate Level Stairs-Number of Steps: pt stays on main level   Bathroom Shower/Tub: Producer, television/film/video: Handicapped height Bathroom Accessibility: Yes   Home Equipment: Shower seat;Grab bars - tub/shower;Rolling Walker (2 wheels)   Additional Comments: has a caregiver 2 hours every day, helps with bathing, dressing. pt's sister Jermaine Nixon does a lot for him during the day.      Prior Functioning/Environment Prior Level of Function : Independent/Modified Independent             Mobility Comments: per pt's sister he is typically independent and does not use RW for gait but she watches him closely. pt requires ADLs Comments: pt wears depends and requires assist for dressing, bathing, and toileting - typically able to assist.        OT Problem List: Decreased strength;Decreased activity tolerance;Impaired balance (sitting and/or standing);Decreased cognition;Decreased knowledge of use of DME or AE      OT Treatment/Interventions:      OT Goals(Current goals can be found in the care plan section) Acute Rehab OT Goals OT Goal Formulation: All assessment and education  complete, DC therapy  OT Frequency:      Co-evaluation PT/OT/SLP Co-Evaluation/Treatment:  (co-eval) Reason for Co-Treatment: For patient/therapist safety;To address functional/ADL transfers PT goals addressed during session: Mobility/safety with mobility;Balance        AM-PAC OT "6 Clicks" Daily Activity     Outcome Measure Help from another person eating meals?: A Little Help from another person taking care of personal grooming?: A Little Help from another person toileting, which includes using toliet, bedpan, or urinal?: A Lot Help from another person bathing (including washing, rinsing, drying)?: A Lot Help from another person to put on and taking off regular upper body clothing?: A Little Help from another person to put on and taking off regular lower body clothing?: A Lot 6 Click Score: 15   End of Session Equipment Utilized During Treatment: Gait belt Nurse Communication: Mobility status  Activity Tolerance: Patient tolerated treatment well Patient left: in chair;with call bell/phone within reach;with family/visitor present  OT Visit Diagnosis: Muscle weakness (generalized) (M62.81)  Time: 6387-5643 OT Time Calculation (min): 28 min Charges:  OT General Charges $OT Visit: 1 Visit OT Evaluation $OT Eval Low Complexity: 1 Low  Marketia Stallsmith, OTR/L Acute Care Rehab Services  Office [phone] Pager: [phone]   Kelli Churn 02/10/2021, 2:33 PM

## 2021-02-10 NOTE — Evaluation (Signed)
Physical Therapy Evaluation Patient Details Name: Jermaine Nixon MRN: [id number] DOB: 1945-11-11 Today's Date: 02/10/2021  History of Present Illness  Patient is 76 y.o. male admitted to [facility] with AMS, weakness, oral irritation, recent h/o weight loss.  CXR negative.  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   Clinical Impression  Jermaine Nixon is 76 y.o. male admitted with above HPI and diagnosis. Patient is currently limited by functional impairments below (see PT problem list). Patient lives with sister and requires supervision to min assist for all mobility and assist for all ADL's at baseline. He required min assist for bed mobility and Mod+2 assist for safety with transfers and gait today and to prevent LOB. Pt's posture improved in standing with HHA vs with use of RW. Patient will benefit from continued skilled PT interventions to address impairments and progress independence with mobility, recommending HHPT with return home and 24/7 assist from family. Acute PT will follow and progress as able.        Recommendations for follow up therapy are one component of a multi-disciplinary discharge planning process, led by the attending physician.  Recommendations may be updated based on patient status, additional functional criteria and insurance authorization.  Follow Up Recommendations Home health PT    Assistance Recommended at Discharge Frequent or constant Supervision/Assistance  Patient can return home with the following  A lot of help with walking and/or transfers;A lot of help with bathing/dressing/bathroom;Assistance with cooking/housework;Direct supervision/assist for medications management;Assist for transportation;Help with stairs or ramp for entrance    Equipment Recommendations None recommended by PT  Recommendations for Other Services       Functional Status  Assessment Patient has had a recent decline in their functional status and demonstrates the ability to make significant improvements in function in a reasonable and predictable amount of time.     Precautions / Restrictions Precautions Precautions: Fall Restrictions Weight Bearing Restrictions: No      Mobility  Bed Mobility Overal bed mobility: Needs Assistance Bed Mobility: Supine to Sit     Supine to sit: Min assist     General bed mobility comments: Min assist to pivot hips with bed pad and scoot to EOB. pt initiated reaching for bed rail and bringing LE's off EOB.    Transfers Overall transfer level: Needs assistance Equipment used: Rolling walker (2 wheels) Transfers: Sit to/from Stand Sit to Stand: Mod assist, +2 safety/equipment           General transfer comment: Mod +2 for safety with rise from EOB. pt completed 2x from EOB and demonstrated safe reach back to recliner at end of gait.    Ambulation/Gait Ambulation/Gait assistance: Mod assist, +2 safety/equipment Gait Distance (Feet): 80 Feet Assistive device: 2 person hand held assist, 1 person hand held assist Gait Pattern/deviations: Step-through pattern, Decreased step length - right, Decreased step length - left, Decreased stride length, Knee flexed in stance - left, Knee flexed in stance - right, Trunk flexed Gait velocity: decr     General Gait Details: pt took several small steps with RW and trunk flexed with Mod assist to steady and manage walker. 2HHA provided and with Mod assist and chair follow and then progressed to 1HHA and Mod assist with no increase in sway during gait. repeated cues needed for posture throughout.  Stairs            Wheelchair Mobility    Modified  Rankin (Stroke Patients Only)       Balance Overall balance assessment: Needs assistance Sitting-balance support: Feet supported Sitting balance-Leahy Scale: Fair     Standing balance support: Reliant on assistive  device for balance, During functional activity, Bilateral upper extremity supported, Single extremity supported Standing balance-Leahy Scale: Poor                               Pertinent Vitals/Pain Pain Assessment Pain Assessment: No/denies pain Pain Intervention(s): Monitored during session    Home Living Family/patient expects to be discharged to:: Private residence Living Arrangements: Other relatives Available Help at Discharge: Family Type of Home: House Home Access: Stairs to enter Entrance Stairs-Rails: None Entrance Stairs-Number of Steps: 1 Alternate Level Stairs-Number of Steps: pt stays on main level Home Layout: Two level;Able to live on main level with bedroom/bathroom;Full bath on main level Home Equipment: Shower seat;Grab bars - tub/shower;Rolling Walker (2 wheels) Additional Comments: has a caregiver 2 hours every day, helps with bathing, dressing. pt's sister [name] does a lot for him during the day.    Prior Function Prior Level of Function : Independent/Modified Independent             Mobility Comments: per pt's sister he is typically independent and does not use RW for gait but she watches him closely. pt requires ADLs Comments: pt wears depends and requires assist for dressing. typically able to assist.     Hand Dominance   Dominant Hand: Right    Extremity/Trunk Assessment        Lower Extremity Assessment Lower Extremity Assessment: Generalized weakness    Cervical / Trunk Assessment Cervical / Trunk Assessment: Kyphotic  Communication   Communication: No difficulties  Cognition Arousal/Alertness: Awake/alert Behavior During Therapy: Flat affect Overall Cognitive Status: History of cognitive impairments - at baseline                                 General Comments: pe rpt's sisters he is at baseline cognition. pt responds appropriately to 75% yes/no questions. speaks in [place] when saying no.         General Comments      Exercises     Assessment/Plan    PT Assessment Patient needs continued PT services  PT Problem List Decreased strength;Decreased activity tolerance;Decreased balance;Decreased mobility;Decreased cognition;Decreased knowledge of use of DME;Decreased safety awareness;Decreased knowledge of precautions       PT Treatment Interventions DME instruction;Gait training;Stair training;Functional mobility training;Therapeutic activities;Therapeutic exercise;Balance training;Neuromuscular re-education;Cognitive remediation;Patient/family education    PT Goals (Current goals can be found in the Care Plan section)  Acute Rehab PT Goals Patient Stated Goal: regain some indpendence and get home PT Goal Formulation: With patient/family Time For Goal Achievement: 02/24/21 Potential to Achieve Goals: Good    Frequency Min 3X/week     Co-evaluation PT/OT/SLP Co-Evaluation/Treatment: Yes Reason for Co-Treatment: For patient/therapist safety;To address functional/ADL transfers PT goals addressed during session: Mobility/safety with mobility;Balance         AM-PAC PT "6 Clicks" Mobility  Outcome Measure Help needed turning from your back to your side while in a flat bed without using bedrails?: A Little Help needed moving from lying on your back to sitting on the side of a flat bed without using bedrails?: A Little Help needed moving to and from a bed to a chair (including a wheelchair)?: A Lot Help  needed standing up from a chair using your arms (e.g., wheelchair or bedside chair)?: A Lot Help needed to walk in hospital room?: A Lot Help needed climbing 3-5 steps with a railing? : A Lot 6 Click Score: 14    End of Session Equipment Utilized During Treatment: Gait belt Activity Tolerance: Patient tolerated treatment well Patient left: in chair;with call bell/phone within reach;with chair alarm set;with family/visitor present Nurse Communication: Mobility status PT  Visit Diagnosis: Muscle weakness (generalized) (M62.81);Difficulty in walking, not elsewhere classified (R26.2);Unsteadiness on feet (R26.81);Other abnormalities of gait and mobility (R26.89)    Time: 1305-1340 PT Time Calculation (min) (ACUTE ONLY): 35 min   Charges:   PT Evaluation $PT Eval Low Complexity: 1 Low          Wynn Maudlin, DPT Acute Rehabilitation Services Office [phone] Pager [phone]   Anitra Lauth 02/10/2021, 2:10 PM

## 2021-02-11 DIAGNOSIS — E872 Acidosis, unspecified: Secondary | ICD-10-CM | POA: Diagnosis not present

## 2021-02-11 LAB — URINALYSIS, ROUTINE W REFLEX MICROSCOPIC
Bacteria, UA: NONE SEEN
Bilirubin Urine: NEGATIVE
Glucose, UA: >=500 mg/dL — AB
Ketones, ur: 20 mg/dL — AB
Leukocytes,Ua: NEGATIVE
Nitrite: NEGATIVE
Protein, ur: NEGATIVE mg/dL
Specific Gravity, Urine: 1.013 (ref 1.005–1.030)
pH: 6 (ref 5.0–8.0)

## 2021-02-11 LAB — BASIC METABOLIC PANEL
Anion gap: 10 (ref 5–15)
BUN: 22 mg/dL (ref 8–23)
CO2: 24 mmol/L (ref 22–32)
Calcium: 9.1 mg/dL (ref 8.9–10.3)
Chloride: 108 mmol/L (ref 98–111)
Creatinine, Ser: 0.89 mg/dL (ref 0.61–1.24)
GFR, Estimated: >60 mL/min (ref >60)
Glucose, Bld: 122 mg/dL — ABNORMAL HIGH (ref 70–99)
Potassium: 4.1 mmol/L (ref 3.5–5.1)
Sodium: 142 mmol/L (ref 135–145)

## 2021-02-11 LAB — GLUCOSE, CAPILLARY
Glucose-Capillary: 111 mg/dL — ABNORMAL HIGH (ref 70–99)
Glucose-Capillary: 183 mg/dL — ABNORMAL HIGH (ref 70–99)

## 2021-02-11 LAB — OSMOLALITY, URINE: Osmolality, Ur: 499 mOsm/kg (ref 300–900)

## 2021-02-11 LAB — CALCIUM, IONIZED: Calcium, Ionized, Serum: 5.4 mg/dL (ref 4.5–5.6)

## 2021-02-11 LAB — OSMOLALITY: Osmolality: 308 mOsm/kg — ABNORMAL HIGH (ref 275–295)

## 2021-02-11 MED ORDER — JARDIANCE 10 MG PO TABS: 10.0000 mg | ORAL_TABLET | Freq: Every day | ORAL | Status: AC

## 2021-02-11 NOTE — TOC Transition Note (Signed)
Transition of Care [facility]) - CM/SW Discharge Note  Patient Details  Name: Jermaine Nixon MRN: [id number] Date of Birth: May 10, 1945  [facility]) CM/SW Contact:  Ewing Schlein, LCSW Phone Number: 02/11/2021, 12:18 PM  Clinical Narrative: Patient will need HHPT, OT, and RN. CSW made referral to Amy with [facility], which was accepted. CSW left VM for patient's sister, Franky Macho, regarding HH. TOC signing off.  Final next level of care: Home w Home Health Services Barriers to Discharge: Barriers Resolved  Patient Goals and CMS Choice Patient states their goals for this hospitalization and ongoing recovery are:: not stated  Discharge Plan and Services Discharge Planning Services: CM Consult  DME Arranged: N/A DME Agency: NA HH Arranged: PT, OT, RN HH Agency: [facility] Date [facility] Agency Contacted: 02/11/21 Representative spoke with at [facility] Agency: Amy  Readmission Risk Interventions No flowsheet data found.

## 2021-02-11 NOTE — Discharge Summary (Signed)
Discharge Summary  Jermaine Nixon WNU:[id number] DOB: [birth year]  PCP: Saintclair Halsted, FNP  Admit date: 02/09/2021 Discharge date: 02/11/2021  Time spent: 27mns, more than 50% time spent on coordination of care.   Recommendations for Outpatient Follow-up:  F/u with PCP within a week  for hospital discharge follow up, repeat cbc/bmp at follow up, PCP to monitor sodium level. Home health   Discharge Diagnoses:  Active Hospital Problems   Diagnosis Date Noted   Lactic acidosis 02/09/2021   Dehydration 02/09/2021   Hypernatremia 02/09/2021   [place]   Hypercalcemia 02/09/2021   Generalized weakness 02/09/2021   Hypertension    HLD (hyperlipidemia)    DM2 (diabetes mellitus, type 2) ([facility]     Resolved Hospital Problems  No resolved problems to display.    Discharge Condition: stable  Diet recommendation: heart healthy/carb modified  Filed Weights   02/09/21 1124 02/09/21 1454 02/11/21 0500  Weight: 56.7 kg 59.1 kg 59.3 kg    History of present illness:  H/o cognitive delay from home, h/o non insulin dependent type 2 diabetes mellitus, [facility] to [facility] , frequent urination  , shakiness at home , found to have lactic acidosis , [facility]     [facility]:  Principal Problem:   Lactic acidosis Active Problems:   Dehydration   Hypernatremia   [place]   Hypercalcemia   Generalized weakness   Hypertension   HLD (hyperlipidemia)   DM2 (diabetes mellitus, type 2) (H[facility])   Assessment and Plan:   Dehydration/hypernatremia/AKI,  -Suspect dehydration due to uncontrolled diabetes /hyperglycemia with taking Jardiance and HCTZ , blood glucose elevated at 3 71 on presentation -Resolved after hydration and holding  jardiance and HCTZ in the hospital -follow-up with PCP next week    Lactic acidosis -Does not appear to have infection, hold off on antibiotic  -Lactic acid normalized after receiving hydration and hold metformin held in the hospital   HTN Blood pressure stable without home bp meds held in hospital Home BP meds held in hospital, resumed at discharge Follow-up with PCP for further blood pressure medication adjustment as needed    NIDDM2, uncontrolled, Presents with hyperglycemia -A1c 9 % -Uncontrolled diabetes might contribute to frequent urination at home, recommend sister check blood glucose 3-4 times a day, bring to PMD for further adjust diabetes medication -Avoid Jardiance if patient is dehydrated     FTT,  home health arranged  Discharge Exam: BP 136/73 (BP Location: Right Arm)    Pulse 80    Temp 98 F (36.7 C)    Resp 17    Ht 6' (1.829 m)    Wt 59.3 kg    SpO2 99%    BMI 17.73 kg/m   General: Alert, interactive, does has cognitive delay Cardiovascular: RRR Respiratory: Normal respiratory effort    Discharge Instructions     Diet Carb Modified   Complete by: As directed    Increase activity slowly   Complete by: As directed       Allergies as of 02/11/2021   No Known Allergies      Medication List     TAKE these medications    aspirin EC 81 MG tablet Take 81 mg by mouth daily as needed for moderate pain.   Assure Comfort Lancets 28G Misc Frequency:   Dosage:0  Instructions:  Note:check glucose daily   atorvastatin 40 MG tablet Commonly known as: LIPITOR Take 40 mg by mouth daily.   benazepril 20 MG tablet Commonly known as: LOTENSIN Take 20 mg by mouth daily.   Fifty50 Glucose Meter 2.0 w/Device Kit Frequency:   Dosage:0     Instructions:  Note:check blood sugar daily   hydrochlorothiazide 25 MG tablet Commonly known as: HYDRODIURIL Take 25 mg by mouth daily.   Jardiance 10 MG Tabs tablet Generic drug: empagliflozin Take 1 tablet (10 mg total) by mouth daily. Hold for now, please follow up with your  pcp to discuss resumption  this medication What changed: additional instructions   metFORMIN 500 MG tablet Commonly known as: GLUCOPHAGE Take 500 mg by mouth 2 (two) times daily with a meal.   pioglitazone 45 MG tablet Commonly known as: ACTOS Take 45 mg by mouth daily.   Prodigy No Coding Blood Gluc test strip Generic drug: glucose blood Frequency:Daily   Dosage:1     Instructions:  Note:TEST ONCE DAILY.   tamsulosin 0.4 MG Caps capsule Commonly known as: FLOMAX Take 0.4 mg by mouth daily after breakfast.       No Known Allergies  Follow-up Information     Saintclair Halsted, FNP Follow up in 1 week(s).   Specialty: Family Medicine Why: hospital discharge follow up, repeat bmp, pcp to monitor sodium level, please monitor fluids intake and urine output, please check blood glucose three to four time a day, bring in record for pcp to review, goal of a1c is less than 7% Contact information: [place] [address] [phone]         [facility] Follow up.   Why: Latricia Heft will provide PT, OT, and nursing services in the home.                 The results of significant diagnostics from this hospitalization (including imaging, microbiology, ancillary and laboratory) are listed below for reference.    Significant Diagnostic Studies: DG Chest 2 View  Result Date: 02/09/2021 CLINICAL DATA:  Altered level of consciousness. EXAM: CHEST - 2 VIEW COMPARISON:  None. FINDINGS: Heart size and vascularity normal. Lungs clear without infiltrate or effusion. No acute skeletal abnormality. IMPRESSION: No active cardiopulmonary disease. Electronically Signed   By: Franchot Gallo M.D.   On: 02/09/2021 12:04   CT Head Wo Contrast  Result Date: 02/09/2021 CLINICAL DATA:  Delirium. EXAM: CT HEAD WITHOUT CONTRAST TECHNIQUE: Contiguous axial images were obtained from the base of the skull through the vertex without intravenous contrast. RADIATION DOSE REDUCTION:  This exam was performed according to the departmental dose-optimization program which includes automated exposure control, adjustment of the mA and/or kV according to patient size and/or use of iterative reconstruction technique. COMPARISON:  Prior head CT 07/03/2018. FINDINGS: Brain: Mildly motion degraded exam. Mild generalized cerebral atrophy.  Moderate cerebellar atrophy. 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 Background moderate patchy and ill-defined hypoattenuation within the cerebral white matter, nonspecific but compatible with chronic small vessel ischemic disease. Redemonstrated small chronic infarct within the inferior right cerebellar hemisphere. There is no acute intracranial hemorrhage. No demarcated cortical infarct. No extra-axial fluid collection. No evidence of an intracranial mass. No midline shift. Vascular: No hyperdense vessel. Atherosclerotic calcifications. Skull: Normal. Negative for fracture or focal lesion. Sinuses/Orbits: Visualized orbits show no acute finding. Mild mucosal thickening within the right maxillary sinus at the imaged  levels. Mild mucosal thickening, and small mucous retention cyst, within the left maxillary sinus at the imaged levels. IMPRESSION: 1. Mildly motion degraded exam. 2. No evidence of acute intracranial abnormality. 3. 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 4. Background moderate chronic small vessel ischemic changes within the cerebral white matter. 5. Redemonstrated small chronic infarct within the inferior right cerebellar hemisphere. 6. Mild generalized cerebral atrophy. 7. Moderate cerebellar atrophy. 8. Paranasal sinus disease at the imaged levels, as described. Electronically Signed   By: Kellie Simmering D.O.   On: 02/09/2021 14:52   CT ABDOMEN PELVIS W CONTRAST  Result Date:  02/09/2021 CLINICAL DATA:  Sepsis EXAM: CT ABDOMEN AND PELVIS WITH CONTRAST TECHNIQUE: Multidetector CT imaging of the abdomen and pelvis was performed using the standard protocol following bolus administration of intravenous contrast. RADIATION DOSE REDUCTION: This exam was performed according to the departmental dose-optimization program which includes automated exposure control, adjustment of the mA and/or kV according to patient size and/or use of iterative reconstruction technique. CONTRAST:  5m OMNIPAQUE IOHEXOL 300 MG/ML  SOLN COMPARISON:  None. FINDINGS: Lower chest: Basilar atelectasis.  Coronary artery calcifications. Hepatobiliary: No focal liver abnormality is seen. No gallstones, gallbladder wall thickening, or biliary dilatation. 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 No surrounding inflammatory change. No main pancreatic duct dilation. Spleen: Normal in size without focal abnormality. Adrenals/Urinary Tract: Bilateral adrenal glands are unremarkable. No hydronephrosis or nephrolithiasis. Scarring of the upper pole of the left kidney. Bladder is unremarkable. Stomach/Bowel: Stomach is within normal limits. Appendix is not visualized due to crowding of the bowel loops related to paucity of intra-fat. No secondary findings of acute appendicitis. No evidence of bowel wall thickening, distention, or inflammatory changes. Rectal stool ball measuring 6.5 cm with rectal wall thickening. Vascular/Lymphatic: Aortic atherosclerosis. No enlarged abdominal or pelvic lymph nodes. Reproductive: Mild prostatomegaly. Other: No intra-abdominal ascites or free air. No intra-abdominal fluid collections. Musculoskeletal: No acute or significant osseous findings. IMPRESSION: 1. No findings in the abdomen or pelvis to explain sepsis. 2. Rectal stool ball with no rectal wall thickening. 3.  Aortic Atherosclerosis (ICD10-I70.0). Electronically Signed   By: LYetta GlassmanM.D.    On: 02/09/2021 14:49    Microbiology: Recent Results (from the past 240 hour(s))  Blood Cultures (routine x 2)     Status: None (Preliminary result)   Collection Time: 02/09/21  1:29 PM   Specimen: BLOOD RIGHT FOREARM  Result Value Ref Range Status   Specimen Description   Final    BLOOD RIGHT FOREARM [facility] [address], [id number]   Special Requests   Final    BOTTLES DRAWN AEROBIC AND ANAEROBIC Blood Culture adequate volume Performed at [facility] [address], [place]   Culture   Final    NO GROWTH 2 DAYS Performed at [facility] [address], [place] [id number]   Report Status PENDING  Incomplete  Blood Cultures (routine x 2)     Status: None (Preliminary result)   Collection Time: 02/09/21  2:54 PM   Specimen: BLOOD  Result Value Ref Range Status   Specimen Description   Final    BLOOD BLOOD LEFT FOREARM Performed at [facility] [address] [place] [address]   Special Requests   Final  BOTTLES DRAWN AEROBIC AND ANAEROBIC BLOOD LEFT FOREARM Performed at [facility], [address] [place], [place] [address]    Culture   Final    NO GROWTH 2 DAYS Performed at [facility], [place] [address], [place], [place] [address]    Report Status PENDING  Incomplete  Resp Panel by RT-PCR (Flu A&B, Covid) Nasopharyngeal Swab     Status: None   Collection Time: 02/09/21  3:13 PM   Specimen: Nasopharyngeal Swab; Nasopharyngeal(NP) swabs in vial transport medium  Result Value Ref Range Status   SARS Coronavirus 2 by RT PCR NEGATIVE NEGATIVE Final    Comment: (NOTE) SARS-CoV-2 target nucleic acids are NOT DETECTED.  The SARS-CoV-2 RNA is generally detectable in upper respiratory specimens during the acute phase of infection. The lowest concentration of SARS-CoV-2 viral copies this assay can detect is 138 copies/mL. A negative result does  not preclude SARS-Cov-2 infection and should not be used as the sole basis for treatment or other patient management decisions. A negative result may occur with  improper specimen collection/handling, submission of specimen other than nasopharyngeal swab, presence of viral mutation(s) within the areas targeted by this assay, and inadequate number of viral copies(<138 copies/mL). A negative result must be combined with clinical observations, patient history, and epidemiological information. The expected result is Negative.  Fact Sheet for Patients:  EntrepreneurPulse.com.au  Fact Sheet for Healthcare Providers:  IncredibleEmployment.be  This test is no t yet approved or cleared by the [place] FDA and  has been authorized for detection and/or diagnosis of SARS-CoV-2 by FDA under an Emergency Use Authorization (EUA). This EUA will remain  in effect (meaning this test can be used) for the duration of the COVID-19 declaration under Section 564(b)(1) of the Act, 21 U.S.C.section 360bbb-3(b)(1), unless the authorization is terminated  or revoked sooner.       Influenza A by PCR NEGATIVE NEGATIVE Final   Influenza B by PCR NEGATIVE NEGATIVE Final    Comment: (NOTE) The Xpert Xpress SARS-CoV-2/FLU/RSV plus assay is intended as an aid in the diagnosis of influenza from Nasopharyngeal swab specimens and should not be used as a sole basis for treatment. Nasal washings and aspirates are unacceptable for Xpert Xpress SARS-CoV-2/FLU/RSV testing.  Fact Sheet for Patients: EntrepreneurPulse.com.au  Fact Sheet for Healthcare Providers: IncredibleEmployment.be  This test is not yet approved or cleared by the [place] FDA and has been authorized for detection and/or diagnosis of SARS-CoV-2 by FDA under an Emergency Use Authorization (EUA). This EUA will remain in effect (meaning this test can be used) for the  duration of the COVID-19 declaration under Section 564(b)(1) of the Act, 21 U.S.C. section 360bbb-3(b)(1), unless the authorization is terminated or revoked.  Performed at [facility], [address] [place], [place] [address]      Labs: Basic Metabolic Panel: Recent Labs  Lab 02/09/21 1132 02/09/21 2308 02/10/21 0512 02/11/21 0843  NA 148* 147* 146* 142  K 4.7 3.6 4.0 4.1  CL 109 113* 111 108  CO2 _0 GLUCOSE 371* 127* 130* 122*  BUN 55* 38* 32* 22  CREATININE 1.57* 1.08 1.10 0.89  CALCIUM 10.7* 9.4 9.5 9.1  MG  --   --  1.9  --   PHOS  --   --  3.4  --    Liver Function Tests: Recent Labs  Lab 02/09/21 1132 02/10/21 0512  AST 28 22  ALT 21 17  ALKPHOS  69 48  BILITOT 0.4 0.4  PROT 8.6* 6.5  ALBUMIN 4.5 3.5   No results for input(s): LIPASE, AMYLASE in the last 168 hours. Recent Labs  Lab 02/09/21 1329  AMMONIA 26   CBC: Recent Labs  Lab 02/09/21 1132 02/10/21 0512  WBC 7.9 7.5  NEUTROABS 6.0 4.8  HGB 13.5 12.1*  HCT 42.6 38.6*  MCV 96.4 98.5  PLT 248 151   Cardiac Enzymes: Recent Labs  Lab 02/09/21 2033  CKTOTAL 102   BNP: BNP (last 3 results) No results for input(s): BNP in the last 8760 hours.  ProBNP (last 3 results) No results for input(s): PROBNP in the last 8760 hours.  CBG: Recent Labs  Lab 02/10/21 1156 02/10/21 1758 02/10/21 2125 02/11/21 0736 02/11/21 1148  GLUCAP 93 154* 157* 111* 183*    FURTHER DISCHARGE INSTRUCTIONS:   Get Medicines reviewed and adjusted: Please take all your medications with you for your next visit with your Primary MD   Laboratory/radiological data: Please request your Primary MD to go over all hospital tests and procedure/radiological results at the follow up, please ask your Primary MD to get all Hospital records sent to his/her office.   In some cases, they will be blood work, cultures and biopsy results pending at the time of your discharge. Please request that your  primary care M.D. goes through all the records of your hospital data and follows up on these results.   Also Note the following: If you experience worsening of your admission symptoms, develop shortness of breath, life threatening emergency, suicidal or homicidal thoughts you must seek medical attention immediately by calling 911 or calling your MD immediately  if symptoms less severe.   You must read complete instructions/literature along with all the possible adverse reactions/side effects for all the Medicines you take and that have been prescribed to you. Take any new Medicines after you have completely understood and accpet all the possible adverse reactions/side effects.    Do not drive when taking Pain medications or sleeping medications (Benzodaizepines)   Do not take more than prescribed Pain, Sleep and Anxiety Medications. It is not advisable to combine anxiety,sleep and pain medications without talking with your primary care practitioner   Special Instructions: If you have smoked or chewed Tobacco  in the last 2 yrs please stop smoking, stop any regular Alcohol  and or any Recreational drug use.   Wear Seat belts while driving.   Please note: You were cared for by a hospitalist during your hospital stay. Once you are discharged, your primary care physician will handle any further medical issues. Please note that NO REFILLS for any discharge medications will be authorized once you are discharged, as it is imperative that you return to your primary care physician (or establish a relationship with a primary care physician if you do not have one) for your post hospital discharge needs so that they can reassess your need for medications and monitor your lab values.     Signed:  Florencia Reasons MD, PhD, FACP  [facility] 02/11/2021, 2:14 PM

## 2021-02-14 LAB — CULTURE, BLOOD (ROUTINE X 2)
Culture: NO GROWTH
Culture: NO GROWTH
Special Requests: ADEQUATE

## 2021-02-15 LAB — METHYLMALONIC ACID, SERUM: Methylmalonic Acid, Quantitative: 247 nmol/L (ref 0–378)

## 2021-04-06 ENCOUNTER — Emergency Department (HOSPITAL_BASED_OUTPATIENT_CLINIC_OR_DEPARTMENT_OTHER): Payer: Medicare Other

## 2021-04-06 ENCOUNTER — Encounter (HOSPITAL_BASED_OUTPATIENT_CLINIC_OR_DEPARTMENT_OTHER): Payer: Self-pay | Admitting: Emergency Medicine

## 2021-04-06 ENCOUNTER — Other Ambulatory Visit: Payer: Self-pay

## 2021-04-06 ENCOUNTER — Inpatient Hospital Stay (HOSPITAL_BASED_OUTPATIENT_CLINIC_OR_DEPARTMENT_OTHER)
Admission: EM | Admit: 2021-04-06 | Discharge: 2021-04-13 | DRG: 683 | Disposition: A | Discharge status: Skilled Nursing Facility | Payer: Medicare Other | Attending: Internal Medicine | Admitting: Internal Medicine

## 2021-04-06 DIAGNOSIS — R479 Unspecified speech disturbances: Secondary | ICD-10-CM | POA: Diagnosis present

## 2021-04-06 DIAGNOSIS — E119 Type 2 diabetes mellitus without complications: Secondary | ICD-10-CM

## 2021-04-06 DIAGNOSIS — Z7982 Long term (current) use of aspirin: Secondary | ICD-10-CM

## 2021-04-06 DIAGNOSIS — Z833 Family history of diabetes mellitus: Secondary | ICD-10-CM

## 2021-04-06 DIAGNOSIS — Z20822 Contact with and (suspected) exposure to covid-19: Secondary | ICD-10-CM | POA: Diagnosis present

## 2021-04-06 DIAGNOSIS — E860 Dehydration: Secondary | ICD-10-CM | POA: Diagnosis present

## 2021-04-06 DIAGNOSIS — E875 Hyperkalemia: Secondary | ICD-10-CM

## 2021-04-06 DIAGNOSIS — T182XXA Foreign body in stomach, initial encounter: Secondary | ICD-10-CM | POA: Diagnosis present

## 2021-04-06 DIAGNOSIS — Z8249 Family history of ischemic heart disease and other diseases of the circulatory system: Secondary | ICD-10-CM

## 2021-04-06 DIAGNOSIS — F99 Mental disorder, not otherwise specified: Secondary | ICD-10-CM | POA: Diagnosis present

## 2021-04-06 DIAGNOSIS — E785 Hyperlipidemia, unspecified: Secondary | ICD-10-CM

## 2021-04-06 DIAGNOSIS — R195 Other fecal abnormalities: Secondary | ICD-10-CM

## 2021-04-06 DIAGNOSIS — I959 Hypotension, unspecified: Secondary | ICD-10-CM | POA: Diagnosis present

## 2021-04-06 DIAGNOSIS — E872 Acidosis, unspecified: Secondary | ICD-10-CM | POA: Diagnosis not present

## 2021-04-06 DIAGNOSIS — N179 Acute kidney failure, unspecified: Secondary | ICD-10-CM | POA: Diagnosis not present

## 2021-04-06 DIAGNOSIS — Z538 Procedure and treatment not carried out for other reasons: Secondary | ICD-10-CM | POA: Diagnosis present

## 2021-04-06 DIAGNOSIS — K5900 Constipation, unspecified: Secondary | ICD-10-CM | POA: Diagnosis present

## 2021-04-06 DIAGNOSIS — R531 Weakness: Principal | ICD-10-CM

## 2021-04-06 DIAGNOSIS — E871 Hypo-osmolality and hyponatremia: Secondary | ICD-10-CM | POA: Diagnosis present

## 2021-04-06 DIAGNOSIS — Z7984 Long term (current) use of oral hypoglycemic drugs: Secondary | ICD-10-CM

## 2021-04-06 DIAGNOSIS — D649 Anemia, unspecified: Secondary | ICD-10-CM

## 2021-04-06 DIAGNOSIS — Z79899 Other long term (current) drug therapy: Secondary | ICD-10-CM

## 2021-04-06 DIAGNOSIS — I10 Essential (primary) hypertension: Secondary | ICD-10-CM

## 2021-04-06 DIAGNOSIS — R0902 Hypoxemia: Secondary | ICD-10-CM

## 2021-04-06 DIAGNOSIS — E440 Moderate protein-calorie malnutrition: Secondary | ICD-10-CM | POA: Diagnosis present

## 2021-04-06 DIAGNOSIS — N400 Enlarged prostate without lower urinary tract symptoms: Secondary | ICD-10-CM | POA: Diagnosis present

## 2021-04-06 DIAGNOSIS — Z681 Body mass index (BMI) 19 or less, adult: Secondary | ICD-10-CM

## 2021-04-06 DIAGNOSIS — E878 Other disorders of electrolyte and fluid balance, not elsewhere classified: Secondary | ICD-10-CM | POA: Diagnosis present

## 2021-04-06 DIAGNOSIS — X58XXXA Exposure to other specified factors, initial encounter: Secondary | ICD-10-CM | POA: Diagnosis present

## 2021-04-06 DIAGNOSIS — E663 Overweight: Secondary | ICD-10-CM | POA: Diagnosis present

## 2021-04-06 DIAGNOSIS — E1165 Type 2 diabetes mellitus with hyperglycemia: Secondary | ICD-10-CM | POA: Diagnosis present

## 2021-04-06 LAB — CBC WITH DIFFERENTIAL/PLATELET
Abs Immature Granulocytes: 0.03 10*3/uL (ref 0.00–0.07)
Basophils Absolute: 0 10*3/uL (ref 0.0–0.1)
Basophils Relative: 0 %
Eosinophils Absolute: 0 10*3/uL (ref 0.0–0.5)
Eosinophils Relative: 0 %
HCT: 29 % — ABNORMAL LOW (ref 39.0–52.0)
Hemoglobin: 10 g/dL — ABNORMAL LOW (ref 13.0–17.0)
Immature Granulocytes: 0 %
Lymphocytes Relative: 17 %
Lymphs Abs: 1.8 10*3/uL (ref 0.7–4.0)
MCH: 30.9 pg (ref 26.0–34.0)
MCHC: 34.5 g/dL (ref 30.0–36.0)
MCV: 89.5 fL (ref 80.0–100.0)
Monocytes Absolute: 0.7 10*3/uL (ref 0.1–1.0)
Monocytes Relative: 7 %
Neutro Abs: 8.1 10*3/uL — ABNORMAL HIGH (ref 1.7–7.7)
Neutrophils Relative %: 76 %
Platelets: 234 10*3/uL (ref 150–400)
RBC: 3.24 MIL/uL — ABNORMAL LOW (ref 4.22–5.81)
RDW: 15.5 % (ref 11.5–15.5)
WBC: 10.7 10*3/uL — ABNORMAL HIGH (ref 4.0–10.5)
nRBC: 0 % (ref 0.0–0.2)

## 2021-04-06 LAB — URINALYSIS, ROUTINE W REFLEX MICROSCOPIC
Bilirubin Urine: NEGATIVE
Glucose, UA: NEGATIVE mg/dL
Hgb urine dipstick: NEGATIVE
Ketones, ur: 15 mg/dL — AB
Leukocytes,Ua: NEGATIVE
Nitrite: NEGATIVE
Protein, ur: NEGATIVE mg/dL
Specific Gravity, Urine: 1.02 (ref 1.005–1.030)
pH: 5 (ref 5.0–8.0)

## 2021-04-06 LAB — COMPREHENSIVE METABOLIC PANEL
ALT: 16 U/L (ref 0–44)
AST: 26 U/L (ref 15–41)
Albumin: 3.5 g/dL (ref 3.5–5.0)
Alkaline Phosphatase: 53 U/L (ref 38–126)
Anion gap: 13 (ref 5–15)
BUN: 58 mg/dL — ABNORMAL HIGH (ref 8–23)
CO2: 26 mmol/L (ref 22–32)
Calcium: 9.6 mg/dL (ref 8.9–10.3)
Chloride: 92 mmol/L — ABNORMAL LOW (ref 98–111)
Creatinine, Ser: 1.55 mg/dL — ABNORMAL HIGH (ref 0.61–1.24)
GFR, Estimated: 46 mL/min — ABNORMAL LOW (ref >60)
Glucose, Bld: 202 mg/dL — ABNORMAL HIGH (ref 70–99)
Potassium: 5.6 mmol/L — ABNORMAL HIGH (ref 3.5–5.1)
Sodium: 131 mmol/L — ABNORMAL LOW (ref 135–145)
Total Bilirubin: 0.5 mg/dL (ref 0.3–1.2)
Total Protein: 7 g/dL (ref 6.5–8.1)

## 2021-04-06 LAB — OCCULT BLOOD X 1 CARD TO LAB, STOOL: Fecal Occult Bld: POSITIVE — AB

## 2021-04-06 LAB — CBG MONITORING, ED: Glucose-Capillary: 216 mg/dL — ABNORMAL HIGH (ref 70–99)

## 2021-04-06 LAB — TROPONIN I (HIGH SENSITIVITY): Troponin I (High Sensitivity): 46 ng/L — ABNORMAL HIGH (ref <18)

## 2021-04-06 LAB — LACTIC ACID, PLASMA: Lactic Acid, Venous: 4.9 mmol/L (ref 0.5–1.9)

## 2021-04-06 LAB — LIPASE, BLOOD: Lipase: 33 U/L (ref 11–51)

## 2021-04-06 MED ORDER — METRONIDAZOLE 500 MG/100ML IV SOLN
500.0000 mg | Freq: Once | INTRAVENOUS | Status: AC
  Administered 2021-04-07: 500 mg via INTRAVENOUS
  Filled 2021-04-06: qty 100

## 2021-04-06 MED ORDER — VANCOMYCIN HCL IN DEXTROSE 1-5 GM/200ML-% IV SOLN
1000.0000 mg | Freq: Once | INTRAVENOUS | Status: AC
  Administered 2021-04-07: 1000 mg via INTRAVENOUS
  Filled 2021-04-06: qty 200

## 2021-04-06 MED ORDER — LACTATED RINGERS IV SOLN: INTRAVENOUS | Status: DC

## 2021-04-06 MED ORDER — LACTATED RINGERS IV BOLUS
1000.0000 mL | Freq: Once | INTRAVENOUS | Status: AC
  Administered 2021-04-06: 1000 mL via INTRAVENOUS

## 2021-04-06 MED ORDER — SODIUM CHLORIDE 0.9 % IV SOLN
2.0000 g | Freq: Two times a day (BID) | INTRAVENOUS | Status: DC
  Administered 2021-04-07 – 2021-04-09 (×5): 2 g via INTRAVENOUS
  Filled 2021-04-06 (×5): qty 12.5

## 2021-04-06 MED ORDER — VANCOMYCIN HCL 1250 MG/250ML IV SOLN
1250.0000 mg | INTRAVENOUS | Status: DC
  Administered 2021-04-09: 1250 mg via INTRAVENOUS
  Filled 2021-04-06 (×2): qty 250

## 2021-04-06 MED ORDER — LACTATED RINGERS IV BOLUS (SEPSIS)
800.0000 mL | Freq: Once | INTRAVENOUS | Status: AC
  Administered 2021-04-06: 800 mL via INTRAVENOUS

## 2021-04-06 MED ORDER — SODIUM CHLORIDE 0.9 % IV SOLN
2.0000 g | Freq: Once | INTRAVENOUS | Status: AC
  Administered 2021-04-06: 2 g via INTRAVENOUS
  Filled 2021-04-06: qty 12.5

## 2021-04-06 MED ORDER — IOHEXOL 300 MG/ML  SOLN
100.0000 mL | Freq: Once | INTRAMUSCULAR | Status: AC | PRN
  Administered 2021-04-06: 100 mL via INTRAVENOUS

## 2021-04-06 NOTE — ED Provider Notes (Signed)
?[facility] ?Provider Note ? ? ?CSN: [id number] ?Arrival date & time: 04/06/21  2117 ? ?  ? ?History ? ?Chief Complaint  ?Patient presents with  ? Weakness  ? ? ?Jermaine Nixon is a 76 y.o. male.  He is brought in by his daughter who is providing most of the history.  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  Daughter states he was with a caregiver today when he finished dinner and stood up he started shaking.  Was very weak.  EMS was called who evaluated him and recommended transport but they declined.  They told him his blood pressure was low.  Family brought him here for evaluation.  No recent cough vomiting diarrhea, has been eating and drinking well.  He was admitted at the beginning of February for weakness and had an elevated lactic acid.  Ultimately no infectious source was found and he was discharged.  At baseline he can ambulate but is very slow and unsteady so they usually use a wheelchair.  No known fevers. ? ?The history is provided by the patient and a relative.  ?Weakness ?Severity:  Moderate ?Onset quality:  Sudden ?Duration:  2 hours ?Timing:  Constant ?Progression:  Unchanged ?Chronicity:  Recurrent ?Relieved by:  None tried ?Worsened by:  Activity ?Ineffective treatments:  None tried ?Associated symptoms: no abdominal pain, no cough, no diarrhea, no fever, no headaches and no shortness of breath   ?Risk factors: diabetes   ? ?  ? ?Home Medications ?Prior to Admission medications   ?Medication Sig Start Date End Date Taking? Authorizing Provider  ?aspirin EC 81 MG tablet Take 81 mg by mouth daily as needed for moderate pain. 05/12/10   [provider]  ?Assure Comfort Lancets 28G MISC Frequency:   Dosage:0     Instructions:  Note:check glucose daily 10/22/09   [provider]  ?atorvastatin (LIPITOR) 40 MG tablet Take 40 mg by mouth daily. 01/28/21   [provider]  ?benazepril (LOTENSIN) 20 MG tablet  Take 20 mg by mouth daily. 05/12/10   [provider]  ?Blood Glucose Monitoring Suppl (FIFTY50 GLUCOSE METER 2.0) w/Device KIT Frequency:   Dosage:0     Instructions:  Note:check blood sugar daily 10/22/09   [provider]  ?glucose blood (PRODIGY NO CODING BLOOD GLUC) test strip Frequency:Daily   Dosage:1     Instructions:  Note:TEST ONCE DAILY. 10/22/09   [provider]  ?hydrochlorothiazide (HYDRODIURIL) 25 MG tablet Take 25 mg by mouth daily. 05/12/10   [provider]  ?JARDIANCE 10 MG TABS tablet Take 1 tablet (10 mg total) by mouth daily. Hold for now, please follow up with your pcp to discuss resumption  this medication 02/11/21   Florencia Reasons, MD  ?metFORMIN (GLUCOPHAGE) 500 MG tablet Take 500 mg by mouth 2 (two) times daily with a meal. 05/12/10   [provider]  ?pioglitazone (ACTOS) 45 MG tablet Take 45 mg by mouth daily. 05/12/10   [provider]  ?tamsulosin (FLOMAX) 0.4 MG CAPS capsule Take 0.4 mg by mouth daily after breakfast.    [provider]  ?   ? ?Allergies    ?Patient has no known allergies.   ? ?Review of Systems   ?Review of Systems  ?Unable to perform ROS: Patient nonverbal  ?Constitutional:  Negative for fever.  ?Respiratory:  Negative for cough and shortness of breath.   ?Gastrointestinal:  Negative for abdominal pain and diarrhea.  ?  Neurological:  Positive for weakness. Negative for headaches.  ? ?Physical Exam ?Updated Vital Signs ?BP (!) 115/54 (BP Location: Right Arm)   Pulse 96   Temp 98.1 ?F (36.7 ?C) (Oral)   Resp 20   Ht 6' (1.829 m)   Wt 59.3 kg   SpO2 97%   BMI 17.73 kg/m?  ?Physical Exam ?Vitals and nursing note reviewed.  ?Constitutional:   ?   General: He is not in acute distress. ?   Appearance: Normal appearance. He is well-developed.  ?HENT:  ?   Head: Normocephalic and atraumatic.  ?Eyes:  ?   Conjunctiva/sclera: Conjunctivae normal.  ?Cardiovascular:  ?   Rate and Rhythm: Normal rate and regular rhythm.   ?   Heart sounds: No murmur heard. ?Pulmonary:  ?   Effort: Pulmonary effort is normal. No respiratory distress.  ?   Breath sounds: Normal breath sounds.  ?Abdominal:  ?   Palpations: Abdomen is soft.  ?   Tenderness: There is no abdominal tenderness. There is no guarding or rebound.  ?Musculoskeletal:     ?   General: No swelling. Normal range of motion.  ?   Cervical back: Neck supple.  ?   Right lower leg: No edema.  ?   Left lower leg: No edema.  ?Skin: ?   General: Skin is warm and dry.  ?   Capillary Refill: Capillary refill takes less than 2 seconds.  ?Neurological:  ?   General: No focal deficit present.  ?   Mental Status: He is alert.  ?   Comments: Unable to assess orientation.  He has no facial asymmetry.  He has somewhat garbled speech.  He is able to follow commands with arms and legs and does not have any focal deficits.  ? ? ?ED Results / Procedures / Treatments   ?Labs ?(all labs ordered are listed, but only abnormal results are displayed) ?Labs Reviewed  ?CBC WITH DIFFERENTIAL/PLATELET - Abnormal; Notable for the following components:  ?    Result Value  ? WBC 10.7 (*)   ? RBC 3.24 (*)   ? Hemoglobin 10.0 (*)   ? HCT 29.0 (*)   ? Neutro Abs 8.1 (*)   ? All other components within normal limits  ?COMPREHENSIVE METABOLIC PANEL - Abnormal; Notable for the following components:  ? Sodium 131 (*)   ? Potassium 5.6 (*)   ? Chloride 92 (*)   ? Glucose, Bld 202 (*)   ? BUN 58 (*)   ? Creatinine, Ser 1.55 (*)   ? GFR, Estimated 46 (*)   ? All other components within normal limits  ?LACTIC ACID, PLASMA - Abnormal; Notable for the following components:  ? Lactic Acid, Venous 4.9 (*)   ? All other components within normal limits  ?URINALYSIS, ROUTINE W REFLEX MICROSCOPIC - Abnormal; Notable for the following components:  ? Ketones, ur 15 (*)   ? All other components within normal limits  ?CBG MONITORING, ED - Abnormal; Notable for the following components:  ? Glucose-Capillary 216 (*)   ? All other  components within normal limits  ?TROPONIN I (HIGH SENSITIVITY) - Abnormal; Notable for the following components:  ? Troponin I (High Sensitivity) 46 (*)   ? All other components within normal limits  ?CULTURE, BLOOD (ROUTINE X 2)  ?CULTURE, BLOOD (ROUTINE X 2)  ?RESP PANEL BY RT-PCR (FLU A&B, COVID) [id number]  ?LIPASE, BLOOD  ?LACTIC ACID, PLASMA  ?OCCULT BLOOD X 1 CARD TO LAB, STOOL  ? ? ?  EKG ?EKG Interpretation ? ?Date/Time:  Wednesday April 06 2021 21:40:01 EDT ?Ventricular Rate:  91 ?PR Interval:  153 ?QRS Duration: 121 ?QT Interval:  340 ?QTC Calculation: 419 ?R Axis:   -41 ?Text Interpretation: Sinus rhythm Consider right atrial enlargement Left bundle branch block increased ST? depressions and T wave inversions from prior 2/23 Confirmed by Aletta Edouard [phone]) on 04/06/2021 9:46:43 PM ? ?Radiology ?DG Chest Port 1 View ? ?Result Date: 04/06/2021 ?CLINICAL DATA:  Weakness EXAM: PORTABLE CHEST 1 VIEW COMPARISON:  02/09/2021 FINDINGS: Heart is normal size. Tortuous aorta. No confluent opacities or effusions. No acute bony abnormality. IMPRESSION: No active disease. Electronically Signed   By: Rolm Baptise M.D.   On: 04/06/2021 22:18   ? ?Procedures ?Marland KitchenCritical Care ?Performed by: Hayden Rasmussen, MD ?Authorized by: Hayden Rasmussen, MD  ? ?Critical care provider statement:  ?  Critical care time (minutes):  45 ?  Critical care time was exclusive of:  Separately billable procedures and treating other patients ?  Critical care was necessary to treat or prevent imminent or life-threatening deterioration of the following conditions:  Sepsis ?  Critical care was time spent personally by me on the following activities:  Development of treatment plan with patient or surrogate, discussions with consultants, evaluation of patient's response to treatment, examination of patient, obtaining history from patient or surrogate, ordering and performing treatments and interventions, ordering and review of laboratory studies,  ordering and review of radiographic studies, pulse oximetry, re-evaluation of patient's condition and review of old charts ?  I assumed direction of critical care for this patient from another provider in my specialty: no

## 2021-04-06 NOTE — Progress Notes (Signed)
Pharmacy Antibiotic Note ? ?Jermaine Nixon is a 76 y.o. male for which pharmacy has been consulted for cefepime and vancomycin dosing for sepsis. ? ?SCr 1.55 - above baseline ?WBC 10.7; LA 4.9; T 98.1 F; RR 20; HR 86 ? ?Plan: ?Metronidazole per MD ?Cefepime 2g q12hr ?Vancomycin 1000 mg given once in ED ?Will start 1250 mg q48hr (eAUC 461) unless change in renal function ?Trend WBC, Fever, Renal function, & Clinical course ?F/u cultures, clinical course, WBC, fever ?De-escalate when able ? ?Height: 6' (182.9 cm) ?Weight: 59.3 kg (130 lb 11.7 oz) ?IBW/kg (Calculated) : 77.6 ? ?Temp (24hrs), Avg:98.1 ?F (36.7 ?C), Min:98.1 ?F (36.7 ?C), Max:98.1 ?F (36.7 ?C) ? ?Recent Labs  ?Lab 04/06/21 ?2221  ?WBC 10.7*  ?CREATININE 1.55*  ?LATICACIDVEN 4.9*  ?  ?Estimated Creatinine Clearance: 32.9 mL/min (A) (by C-G formula based on SCr of 1.55 mg/dL (H)).   ? ?No Known Allergies ? ?Antimicrobials this admission: ?cefepime 4/5 >>  ?vancomycin 4/5 >>  ?metronidazole 4/5 >>  ? ?Microbiology results: ?Pending ? ?Thank you for allowing pharmacy to be a part of this patient?s care. ? ?Delmar Landau, PharmD, BCPS ?04/06/2021 11:06 PM ?ED Clinical Pharmacist -  [phone] ?  ?

## 2021-04-06 NOTE — ED Triage Notes (Signed)
Family states tonight when the pt was eating he was shaking really bad and when he got up to walk he could not, he was weak and shaky  They called EMS and they told them his blood pressure was low and that he was dehydrated  Family states pt has been eating and drinking okay   ?

## 2021-04-06 NOTE — Progress Notes (Signed)
Elink following Code Sepsis. 

## 2021-04-07 ENCOUNTER — Encounter (HOSPITAL_COMMUNITY): Payer: Self-pay | Admitting: Internal Medicine

## 2021-04-07 ENCOUNTER — Inpatient Hospital Stay (HOSPITAL_COMMUNITY): Payer: Medicare Other

## 2021-04-07 DIAGNOSIS — Z7982 Long term (current) use of aspirin: Secondary | ICD-10-CM | POA: Diagnosis not present

## 2021-04-07 DIAGNOSIS — D509 Iron deficiency anemia, unspecified: Secondary | ICD-10-CM | POA: Diagnosis not present

## 2021-04-07 DIAGNOSIS — E785 Hyperlipidemia, unspecified: Secondary | ICD-10-CM | POA: Diagnosis present

## 2021-04-07 DIAGNOSIS — Z79899 Other long term (current) drug therapy: Secondary | ICD-10-CM | POA: Diagnosis not present

## 2021-04-07 DIAGNOSIS — E860 Dehydration: Secondary | ICD-10-CM | POA: Diagnosis present

## 2021-04-07 DIAGNOSIS — E872 Acidosis, unspecified: Secondary | ICD-10-CM

## 2021-04-07 DIAGNOSIS — E440 Moderate protein-calorie malnutrition: Secondary | ICD-10-CM | POA: Diagnosis present

## 2021-04-07 DIAGNOSIS — E875 Hyperkalemia: Secondary | ICD-10-CM | POA: Diagnosis present

## 2021-04-07 DIAGNOSIS — Z681 Body mass index (BMI) 19 or less, adult: Secondary | ICD-10-CM | POA: Diagnosis not present

## 2021-04-07 DIAGNOSIS — N179 Acute kidney failure, unspecified: Secondary | ICD-10-CM

## 2021-04-07 DIAGNOSIS — Z8249 Family history of ischemic heart disease and other diseases of the circulatory system: Secondary | ICD-10-CM | POA: Diagnosis not present

## 2021-04-07 DIAGNOSIS — E1165 Type 2 diabetes mellitus with hyperglycemia: Secondary | ICD-10-CM | POA: Diagnosis present

## 2021-04-07 DIAGNOSIS — E663 Overweight: Secondary | ICD-10-CM | POA: Diagnosis present

## 2021-04-07 DIAGNOSIS — K5900 Constipation, unspecified: Secondary | ICD-10-CM | POA: Diagnosis present

## 2021-04-07 DIAGNOSIS — I10 Essential (primary) hypertension: Secondary | ICD-10-CM | POA: Diagnosis present

## 2021-04-07 DIAGNOSIS — E119 Type 2 diabetes mellitus without complications: Secondary | ICD-10-CM

## 2021-04-07 DIAGNOSIS — Z833 Family history of diabetes mellitus: Secondary | ICD-10-CM | POA: Diagnosis not present

## 2021-04-07 DIAGNOSIS — R0902 Hypoxemia: Secondary | ICD-10-CM

## 2021-04-07 DIAGNOSIS — N400 Enlarged prostate without lower urinary tract symptoms: Secondary | ICD-10-CM | POA: Diagnosis present

## 2021-04-07 DIAGNOSIS — I959 Hypotension, unspecified: Secondary | ICD-10-CM | POA: Diagnosis present

## 2021-04-07 DIAGNOSIS — D649 Anemia, unspecified: Secondary | ICD-10-CM | POA: Diagnosis not present

## 2021-04-07 DIAGNOSIS — Z20822 Contact with and (suspected) exposure to covid-19: Secondary | ICD-10-CM | POA: Diagnosis present

## 2021-04-07 DIAGNOSIS — R195 Other fecal abnormalities: Secondary | ICD-10-CM

## 2021-04-07 DIAGNOSIS — X58XXXA Exposure to other specified factors, initial encounter: Secondary | ICD-10-CM | POA: Diagnosis present

## 2021-04-07 DIAGNOSIS — E871 Hypo-osmolality and hyponatremia: Secondary | ICD-10-CM | POA: Diagnosis present

## 2021-04-07 DIAGNOSIS — T182XXA Foreign body in stomach, initial encounter: Secondary | ICD-10-CM | POA: Diagnosis present

## 2021-04-07 DIAGNOSIS — Z7984 Long term (current) use of oral hypoglycemic drugs: Secondary | ICD-10-CM | POA: Diagnosis not present

## 2021-04-07 DIAGNOSIS — E878 Other disorders of electrolyte and fluid balance, not elsewhere classified: Secondary | ICD-10-CM | POA: Diagnosis present

## 2021-04-07 DIAGNOSIS — K921 Melena: Secondary | ICD-10-CM | POA: Diagnosis not present

## 2021-04-07 LAB — LACTIC ACID, PLASMA
Lactic Acid, Venous: 3.9 mmol/L (ref 0.5–1.9)
Lactic Acid, Venous: 3.1 mmol/L (ref 0.5–1.9)
Lactic Acid, Venous: 1.1 mmol/L (ref 0.5–1.9)

## 2021-04-07 LAB — GLUCOSE, CAPILLARY
Glucose-Capillary: 141 mg/dL — ABNORMAL HIGH (ref 70–99)
Glucose-Capillary: 209 mg/dL — ABNORMAL HIGH (ref 70–99)
Glucose-Capillary: 87 mg/dL (ref 70–99)
Glucose-Capillary: 143 mg/dL — ABNORMAL HIGH (ref 70–99)

## 2021-04-07 LAB — BASIC METABOLIC PANEL
Anion gap: 9 (ref 5–15)
BUN: 46 mg/dL — ABNORMAL HIGH (ref 8–23)
CO2: 24 mmol/L (ref 22–32)
Calcium: 9.4 mg/dL (ref 8.9–10.3)
Chloride: 100 mmol/L (ref 98–111)
Creatinine, Ser: 1.25 mg/dL — ABNORMAL HIGH (ref 0.61–1.24)
GFR, Estimated: 59 mL/min — ABNORMAL LOW (ref >60)
Glucose, Bld: 170 mg/dL — ABNORMAL HIGH (ref 70–99)
Potassium: 4.5 mmol/L (ref 3.5–5.1)
Sodium: 133 mmol/L — ABNORMAL LOW (ref 135–145)

## 2021-04-07 LAB — RETICULOCYTES
Immature Retic Fract: 23 % — ABNORMAL HIGH (ref 2.3–15.9)
RBC.: 3.05 MIL/uL — ABNORMAL LOW (ref 4.22–5.81)
Retic Count, Absolute: 44.2 10*3/uL (ref 19.0–186.0)
Retic Ct Pct: 1.5 % (ref 0.4–3.1)

## 2021-04-07 LAB — CBC
HCT: 29.6 % — ABNORMAL LOW (ref 39.0–52.0)
Hemoglobin: 9.8 g/dL — ABNORMAL LOW (ref 13.0–17.0)
MCH: 29.8 pg (ref 26.0–34.0)
MCHC: 33.1 g/dL (ref 30.0–36.0)
MCV: 90 fL (ref 80.0–100.0)
Platelets: 191 10*3/uL (ref 150–400)
RBC: 3.29 MIL/uL — ABNORMAL LOW (ref 4.22–5.81)
RDW: 15.4 % (ref 11.5–15.5)
WBC: 7.8 10*3/uL (ref 4.0–10.5)
nRBC: 0 % (ref 0.0–0.2)

## 2021-04-07 LAB — IRON AND TIBC
Iron: 47 ug/dL (ref 45–182)
Saturation Ratios: 16 % — ABNORMAL LOW (ref 17.9–39.5)
TIBC: 297 ug/dL (ref 250–450)
UIBC: 250 ug/dL

## 2021-04-07 LAB — PROCALCITONIN: Procalcitonin: <0.1 ng/mL

## 2021-04-07 LAB — FOLATE: Folate: 12 ng/mL (ref >5.9)

## 2021-04-07 LAB — HEMOGLOBIN A1C
Hgb A1c MFr Bld: 9.9 % — ABNORMAL HIGH (ref 4.8–5.6)
Mean Plasma Glucose: 237.43 mg/dL

## 2021-04-07 LAB — D-DIMER, QUANTITATIVE: D-Dimer, Quant: 0.69 ug/mL-FEU — ABNORMAL HIGH (ref 0.00–0.50)

## 2021-04-07 LAB — TROPONIN I (HIGH SENSITIVITY): Troponin I (High Sensitivity): 47 ng/L — ABNORMAL HIGH (ref <18)

## 2021-04-07 LAB — RESP PANEL BY RT-PCR (FLU A&B, COVID) ARPGX2
Influenza A by PCR: NEGATIVE
Influenza B by PCR: NEGATIVE
SARS Coronavirus 2 by RT PCR: NEGATIVE

## 2021-04-07 LAB — FERRITIN: Ferritin: 50 ng/mL (ref 24–336)

## 2021-04-07 LAB — VITAMIN B12: Vitamin B-12: 190 pg/mL (ref 180–914)

## 2021-04-07 MED ORDER — EMPAGLIFLOZIN 10 MG PO TABS: 10.0000 mg | ORAL_TABLET | Freq: Every day | ORAL | Status: DC

## 2021-04-07 MED ORDER — ACETAMINOPHEN 325 MG PO TABS
650.0000 mg | ORAL_TABLET | Freq: Four times a day (QID) | ORAL | Status: DC | PRN
Start: 2021-04-07 — End: 2021-04-13

## 2021-04-07 MED ORDER — PEG-KCL-NACL-NASULF-NA ASC-C 100 G PO SOLR: 1.0000 | Freq: Once | ORAL | Status: DC

## 2021-04-07 MED ORDER — INSULIN ASPART 100 UNIT/ML IJ SOLN
0.0000 [IU] | Freq: Three times a day (TID) | INTRAMUSCULAR | Status: DC
  Administered 2021-04-07 (×2): 1 [IU] via SUBCUTANEOUS
  Administered 2021-04-07 – 2021-04-08 (×3): 3 [IU] via SUBCUTANEOUS
  Administered 2021-04-08: 1 [IU] via SUBCUTANEOUS
  Administered 2021-04-09: 5 [IU] via SUBCUTANEOUS
  Administered 2021-04-09: 1 [IU] via SUBCUTANEOUS
  Administered 2021-04-09: 5 [IU] via SUBCUTANEOUS
  Administered 2021-04-09: 3 [IU] via SUBCUTANEOUS
  Administered 2021-04-10: 2 [IU] via SUBCUTANEOUS
  Administered 2021-04-10: 3 [IU] via SUBCUTANEOUS
  Administered 2021-04-10: 7 [IU] via SUBCUTANEOUS
  Administered 2021-04-10 – 2021-04-11 (×2): 2 [IU] via SUBCUTANEOUS
  Administered 2021-04-11: 9 [IU] via SUBCUTANEOUS
  Administered 2021-04-11: 2 [IU] via SUBCUTANEOUS
  Administered 2021-04-11 – 2021-04-12 (×2): 5 [IU] via SUBCUTANEOUS
  Administered 2021-04-12 (×2): 2 [IU] via SUBCUTANEOUS
  Administered 2021-04-12 – 2021-04-13 (×3): 3 [IU] via SUBCUTANEOUS

## 2021-04-07 MED ORDER — HYDROCHLOROTHIAZIDE 25 MG PO TABS: 25.0000 mg | ORAL_TABLET | Freq: Every day | ORAL | Status: DC

## 2021-04-07 MED ORDER — ENOXAPARIN SODIUM 40 MG/0.4ML IJ SOSY
40.0000 mg | PREFILLED_SYRINGE | INTRAMUSCULAR | Status: DC
  Administered 2021-04-09 – 2021-04-13 (×6): 40 mg via SUBCUTANEOUS
  Filled 2021-04-07 (×6): qty 0.4

## 2021-04-07 MED ORDER — PEG-KCL-NACL-NASULF-NA ASC-C 100 G PO SOLR
0.5000 | Freq: Once | ORAL | Status: AC
  Administered 2021-04-07: 100 g via ORAL
  Filled 2021-04-07: qty 1

## 2021-04-07 MED ORDER — ASPIRIN EC 81 MG PO TBEC: 81.0000 mg | DELAYED_RELEASE_TABLET | Freq: Every day | ORAL | Status: DC | PRN

## 2021-04-07 MED ORDER — BENAZEPRIL HCL 20 MG PO TABS
20.0000 mg | ORAL_TABLET | Freq: Every day | ORAL | Status: DC
  Filled 2021-04-07: qty 1

## 2021-04-07 MED ORDER — MAGNESIUM HYDROXIDE 400 MG/5ML PO SUSP: 30.0000 mL | Freq: Every day | ORAL | Status: DC | PRN

## 2021-04-07 MED ORDER — ONDANSETRON HCL 4 MG PO TABS: 4.0000 mg | ORAL_TABLET | Freq: Four times a day (QID) | ORAL | Status: DC | PRN

## 2021-04-07 MED ORDER — PANTOPRAZOLE SODIUM 40 MG PO TBEC
40.0000 mg | DELAYED_RELEASE_TABLET | Freq: Two times a day (BID) | ORAL | Status: DC
  Administered 2021-04-07 – 2021-04-13 (×12): 40 mg via ORAL
  Filled 2021-04-07 (×12): qty 1

## 2021-04-07 MED ORDER — GERHARDT'S BUTT CREAM
TOPICAL_CREAM | Freq: Three times a day (TID) | CUTANEOUS | Status: DC | PRN
  Filled 2021-04-07: qty 1

## 2021-04-07 MED ORDER — TAMSULOSIN HCL 0.4 MG PO CAPS
0.4000 mg | ORAL_CAPSULE | Freq: Every day | ORAL | Status: DC
  Administered 2021-04-07 – 2021-04-13 (×6): 0.4 mg via ORAL
  Filled 2021-04-07 (×6): qty 1

## 2021-04-07 MED ORDER — ORAL CARE MOUTH RINSE
15.0000 mL | Freq: Two times a day (BID) | OROMUCOSAL | Status: DC
  Administered 2021-04-07 – 2021-04-13 (×10): 15 mL via OROMUCOSAL

## 2021-04-07 MED ORDER — ONDANSETRON HCL 4 MG/2ML IJ SOLN: 4.0000 mg | Freq: Four times a day (QID) | INTRAMUSCULAR | Status: DC | PRN

## 2021-04-07 MED ORDER — ATORVASTATIN CALCIUM 40 MG PO TABS
40.0000 mg | ORAL_TABLET | Freq: Every day | ORAL | Status: DC
  Administered 2021-04-07 – 2021-04-13 (×6): 40 mg via ORAL
  Filled 2021-04-07 (×6): qty 1

## 2021-04-07 MED ORDER — LIVING WELL WITH DIABETES BOOK
Freq: Once | Status: AC
  Filled 2021-04-07: qty 1

## 2021-04-07 MED ORDER — PIOGLITAZONE HCL 30 MG PO TABS
45.0000 mg | ORAL_TABLET | Freq: Every day | ORAL | Status: DC
  Administered 2021-04-07: 45 mg via ORAL
  Filled 2021-04-07: qty 1

## 2021-04-07 MED ORDER — SODIUM CHLORIDE 0.9 % IV SOLN: INTRAVENOUS | Status: DC

## 2021-04-07 MED ORDER — TRAZODONE HCL 50 MG PO TABS
25.0000 mg | ORAL_TABLET | Freq: Every evening | ORAL | Status: DC | PRN
  Administered 2021-04-12: 25 mg via ORAL
  Filled 2021-04-07: qty 1

## 2021-04-07 MED ORDER — ENOXAPARIN SODIUM 40 MG/0.4ML IJ SOSY
40.0000 mg | PREFILLED_SYRINGE | INTRAMUSCULAR | Status: DC
  Administered 2021-04-07: 40 mg via SUBCUTANEOUS
  Filled 2021-04-07: qty 0.4

## 2021-04-07 MED ORDER — ACETAMINOPHEN 650 MG RE SUPP: 650.0000 mg | Freq: Four times a day (QID) | RECTAL | Status: DC | PRN

## 2021-04-07 MED ORDER — IOHEXOL 350 MG/ML SOLN
100.0000 mL | Freq: Once | INTRAVENOUS | Status: AC | PRN
Start: 2021-04-07 — End: 2021-04-07
  Administered 2021-04-07: 100 mL via INTRAVENOUS

## 2021-04-07 NOTE — ED Provider Notes (Signed)
?  Physical Exam  ?BP (!) 120/58 (BP Location: Left Arm)   Pulse 86   Temp 98.2 ?F (36.8 ?C) (Oral)   Resp 14   Ht 6' (1.829 m)   Wt 59.3 kg   SpO2 95%   BMI 17.73 kg/m?  ? ?Physical Exam ?Vitals and nursing note reviewed.  ?Constitutional:   ?   General: He is not in acute distress. ?   Appearance: He is well-developed. He is not diaphoretic.  ?   Comments: Patient appears to somewhat confused and disoriented, but follows commands appropriately  ?HENT:  ?   Head: Normocephalic and atraumatic.  ?Cardiovascular:  ?   Rate and Rhythm: Normal rate and regular rhythm.  ?   Heart sounds: No murmur heard. ?  No friction rub.  ?Pulmonary:  ?   Effort: Pulmonary effort is normal. No respiratory distress.  ?   Breath sounds: Normal breath sounds. No wheezing or rales.  ?Abdominal:  ?   General: Bowel sounds are normal. There is no distension.  ?   Palpations: Abdomen is soft.  ?   Tenderness: There is no abdominal tenderness.  ?Musculoskeletal:     ?   General: Normal range of motion.  ?   Cervical back: Normal range of motion and neck supple.  ?Skin: ?   General: Skin is warm and dry.  ?Neurological:  ?   Mental Status: He is alert and oriented to person, place, and time.  ?   Coordination: Coordination normal.  ? ? ?Procedures  ?Procedures ? ?ED Course / MDM  ? ?Clinical Course as of 04/07/21 0245  ?Wed Apr 06, 2021  ?2218 Chest x-ray interpreted by me as no clear infiltrates.  Awaiting radiology reading. [MB]  ?2237 Rectal exam done with [facility] as chaperone.  Normal tone no masses brown stool.  Sample sent to lab for guaiac. [MB]  ?  ?Clinical Course User Index ?[MB] Terrilee Files, MD  ? ?Care assumed from Dr. Charm Barges awaiting results of CT scan and laboratory studies.  Patient brought here for evaluation of weakness, chills, and decreased p.o. intake.  Thus far, he was found to have a lactate of 4.9 and the evidence for dehydration in his metabolic panel.  Patient has received antibiotics for concern of  sepsis. ? ?Imaging studies have returned and are unremarkable.  Patient care discussed with Dr. Leafy Half from the hospitalist service and patient will be admitted for further treatment. ? ? ? ? ?  ?Geoffery Lyons, MD ?04/07/21 [phone] ? ?

## 2021-04-07 NOTE — Consult Note (Addendum)
? ? ? Consultation ? ?Referring Provider:   Dr. Tana Coast ?Primary Care Physician:  Saintclair Halsted, FNP ?Primary Gastroenterologist: Althia Forts       ?Reason for Consultation: Anemia ?       ? HPI:   ?Jermaine Nixon is a 76 y.o. male with a past medical history significant for type 2 diabetes, mental disability, BPH and hypertension, who presented to the ER initially with acute onset of generalized weakness and associated hypotension and shaking with eating.  We are consulted in regards to anemia. ?   Today, the patient is seen with his granddaughter by his bedside, she also calls his wife who is his primary caretaker as he does have history of mental disability and is unable to answer questioning.  Per family patient has had occasional problems with constipation but other than this no GI concerns or complaints.  They have not noticed any rectal bleeding/bright red blood in his stools or black tarry sticky stools.  He does not complain of any abdominal pain.  No reflux and does not seem to have problems with eating.  Family denies previous EGD/colonoscopy.  They explain that he was just "declining" at home and due to his mental disability they are unable to figure out how exactly he is feeling. ?   At time I saw the patient , granddaughter called patient's wife who is his caretaker and also his sister, together they all decided that they would appreciate further work-up of his anemia to see if it would help him feel better and stabilized. ?   Family denies fever or complaints of abdominal pain. ?     ?ED course: Hyponatremia 131, hyperkalemia 5.6, glucose 202, BUN 58 with a creatinine 1.55 (02/11/2021 BUN 22, creatinine 0.89), lactic acid 4.9--> 3.9, troponins 46--> 47, CBC with a white count of 10.7; patient given IV Cefepime, Vancomycin and Flagyl as well as fluids ? ?Hospital course: Hemoglobin 12.1 on 02/10/2021 -->10.0--> 9.8 ? ?GI history: ?None ? ?Past Medical History:  ?Diagnosis Date  ? Diabetes mellitus without  complication ([place])   ? Hypertension   ? Mental disability   ? ? ?History reviewed. No pertinent surgical history. ? ?Family History  ?Problem Relation Age of Onset  ? Hypertension Other   ? Diabetes Other   ? ? ?Social History  ? ?Tobacco Use  ? Smoking status: Never  ? Smokeless tobacco: Never  ?Vaping Use  ? Vaping Use: Never used  ?Substance Use Topics  ? Alcohol use: Never  ? Drug use: Never  ? ? ?Prior to Admission medications   ?Medication Sig Start Date End Date Taking? Authorizing Provider  ?aspirin EC 81 MG tablet Take 81 mg by mouth daily as needed for moderate pain. 05/12/10   [provider]  ?Assure Comfort Lancets 28G MISC Frequency:   Dosage:0     Instructions:  Note:check glucose daily 10/22/09   [provider]  ?atorvastatin (LIPITOR) 40 MG tablet Take 40 mg by mouth daily. 01/28/21   [provider]  ?benazepril (LOTENSIN) 20 MG tablet Take 20 mg by mouth daily. 05/12/10   [provider]  ?Blood Glucose Monitoring Suppl (FIFTY50 GLUCOSE METER 2.0) w/Device KIT Frequency:   Dosage:0     Instructions:  Note:check blood sugar daily 10/22/09   [provider]  ?glucose blood (PRODIGY NO CODING BLOOD GLUC) test strip Frequency:Daily   Dosage:1     Instructions:  Note:TEST ONCE DAILY. 10/22/09   [provider]  ?hydrochlorothiazide (HYDRODIURIL) 25  MG tablet Take 25 mg by mouth daily. 05/12/10   [provider]  ?JARDIANCE 10 MG TABS tablet Take 1 tablet (10 mg total) by mouth daily. Hold for now, please follow up with your pcp to discuss resumption  this medication 02/11/21   Florencia Reasons, MD  ?metFORMIN (GLUCOPHAGE) 500 MG tablet Take 500 mg by mouth 2 (two) times daily with a meal. 05/12/10   [provider]  ?pioglitazone (ACTOS) 45 MG tablet Take 45 mg by mouth daily. 05/12/10   [provider]  ?tamsulosin (FLOMAX) 0.4 MG CAPS capsule Take 0.4 mg by mouth daily after breakfast.    [provider]  ? ? ?Current  Facility-Administered Medications  ?Medication Dose Route Frequency Provider Last Rate Last Admin  ? 0.9 %  sodium chloride infusion   Intravenous Continuous Mansy, Jan A, MD 100 mL/hr at 04/07/21 0622 New Bag at 04/07/21 0622  ? acetaminophen (TYLENOL) tablet 650 mg  650 mg Oral Q6H PRN Mansy, Jan A, MD      ? Or  ? acetaminophen (TYLENOL) suppository 650 mg  650 mg Rectal Q6H PRN Mansy, Jan A, MD      ? aspirin EC tablet 81 mg  81 mg Oral Daily PRN Mansy, Jan A, MD      ? atorvastatin (LIPITOR) tablet 40 mg  40 mg Oral Daily Mansy, Jan A, MD   40 mg at 04/07/21 3762  ? ceFEPIme (MAXIPIME) 2 g in sodium chloride 0.9 % 100 mL IVPB  2 g Intravenous Q12H Hayden Rasmussen, MD      ? empagliflozin (JARDIANCE) tablet 10 mg  10 mg Oral Daily Mansy, Jan A, MD      ? enoxaparin (LOVENOX) injection 40 mg  40 mg Subcutaneous Q24H Mansy, Jan A, MD   40 mg at 04/07/21 8315  ? insulin aspart (novoLOG) injection 0-9 Units  0-9 Units Subcutaneous TID [facility] Mansy, Arvella Merles, MD   1 Units at 04/07/21 0959  ? living well with diabetes book MISC   Does not apply Once Elza Rafter, RN      ? magnesium hydroxide (MILK OF MAGNESIA) suspension 30 mL  30 mL Oral Daily PRN Mansy, Jan A, MD      ? ondansetron [facility]) tablet 4 mg  4 mg Oral Q6H PRN Mansy, Jan A, MD      ? Or  ? ondansetron [facility]) injection 4 mg  4 mg Intravenous Q6H PRN Mansy, Jan A, MD      ? pioglitazone (ACTOS) tablet 45 mg  45 mg Oral Daily Mansy, Jan A, MD   45 mg at 04/07/21 1761  ? tamsulosin (FLOMAX) capsule 0.4 mg  0.4 mg Oral QPC breakfast Mansy, Jan A, MD   0.4 mg at 04/07/21 6073  ? traZODone (DESYREL) tablet 25 mg  25 mg Oral QHS PRN Mansy, Jan A, MD      ? [START ON 04/09/2021] vancomycin (VANCOREADY) IVPB 1250 mg/250 mL  1,250 mg Intravenous Q48H Hayden Rasmussen, MD      ? ? ?Allergies as of 04/06/2021  ? (No Known Allergies)  ? ? ? ?Review of Systems:    ?Constitutional: No weight loss, fever or chills ?Skin: No rash  ?Cardiovascular: No chest pain   ?Respiratory: No SOB ?Gastrointestinal: See HPI and otherwise negative ?Genitourinary: No dysuria ?Neurological: No headache, dizziness or syncope ?Musculoskeletal: No new muscle or joint pain ?Hematologic: No bleeding  ?Psychiatric: +mental disability-unable to answer questions ? ?  Physical Exam:  ?Vital signs in last 24 hours: ?Temp:  [97.6 ?F (36.4 ?C)-98.3 ?F (36.8 ?C)] 97.6 ?F (36.4 ?C) (04/06 0912) ?Pulse Rate:  [81-97] 81 (04/06 0912) ?Resp:  [14-23] 16 (04/06 0912) ?BP: (104-120)/(54-69) 116/63 (04/06 0912) ?SpO2:  [91 %-100 %] 99 % (04/06 0912) ?Weight:  [59.3 kg-60 kg] 60 kg (04/06 0200) ?  ?General:   Pleasant AA male appears to be in NAD, Well developed, Well nourished, alert and cooperative ?Head:  Normocephalic and atraumatic. ?Eyes:   PEERL, EOMI. No icterus. Conjunctiva pink. ?Ears:  Normal auditory acuity. ?Neck:  Supple ?Throat: Oral cavity and pharynx without inflammation, swelling or lesion.  ?Lungs: Respirations even and unlabored. Lungs clear to auscultation bilaterally.   No wheezes, crackles, or rhonchi.  ?Heart: Normal S1, S2. No MRG. Regular rate and rhythm. No peripheral edema, cyanosis or pallor.  ?Abdomen:  Soft, nondistended, nontender. No rebound or guarding. Normal bowel sounds. No appreciable masses or hepatomegaly. ?Rectal:  Not performed.  ?Msk:  Symmetrical without gross deformities. Peripheral pulses intact.  ?Extremities:  Without edema, no deformity or joint abnormality. +stiff/hard to move ?Neurologic:  Alert, unable to assess orientation ?Skin:   Dry and intact without significant lesions or rashes. ?Psychiatric: Only answers in one word answers and always says no to questions ? ? ?LAB RESULTS: ?Recent Labs  ?  04/06/21 ?2221 04/07/21 ?0437  ?WBC 10.7* 7.8  ?HGB 10.0* 9.8*  ?HCT 29.0* 29.6*  ?PLT 234 191  ? ?BMET ?Recent Labs  ?  04/06/21 ?2221 04/07/21 ?0437  ?NA 131* 133*  ?K 5.6* 4.5  ?CL 92* 100  ?CO2 26 24  ?GLUCOSE 202* 170*  ?BUN 58* 46*  ?CREATININE 1.55* 1.25*   ?CALCIUM 9.6 9.4  ? ?LFT ?Recent Labs  ?  04/06/21 ?2221  ?PROT 7.0  ?ALBUMIN 3.5  ?AST 26  ?ALT 16  ?ALKPHOS 53  ?BILITOT 0.5  ? ?STUDIES: ?CT Head Wo Contrast ? ?Result Date: 04/06/2021 ?CLINICAL DATA:  Mental status c

## 2021-04-07 NOTE — Progress Notes (Signed)
Inpatient Diabetes Program Recommendations ? ?AACE/ADA: New Consensus Statement on Inpatient Glycemic Control (2015) ? ?Target Ranges:  Prepandial:   less than 140 mg/dL ?     Peak postprandial:   less than 180 mg/dL (1-2 hours) ?     Critically ill patients:  140 - 180 mg/dL  ? ?Lab Results  ?Component Value Date  ? GLUCAP 209 (H) 04/07/2021  ? HGBA1C 9.9 (H) 04/07/2021  ? ? ?Review of Glycemic Control ? Latest Reference Range & Units 04/07/21 07:44 04/07/21 12:36  ?Glucose-Capillary 70 - 99 mg/dL 831 (H) 517 (H)  ?(H): Data is abnormally high ? ?Diabetes history: DM2 ?Outpatient Diabetes medications: Actos 45 mg QD, Glipizide 5 mg QAM, Metformin 500 mg BID, Jardiance 10 mg QD ?Current orders for Inpatient glycemic control: Novolog 0-9 units TID and HS, Actos 45 mg QD, Jardiance 10 mg QD ? ?Spoke with grand niece at bedside.  She is on speaker phone with pts sister who lives in [place].  She verified above home medications.  She states she speaks with her sister whom patient lives with daily.  She checks his BG 2 x a day.  States average readings at 140-150's.  Reviewed patient's current A1c of 9.9% (average blood glucose of 237 mg/dL). Explained what a A1c is and what it measures. Also reviewed goal A1c with patient, importance of good glucose control @ home, and blood sugar goals. ? ?He only drinks water at home.  They follow The Plate Method. Denies having episodes of hypoglycemia.  HGB is 9.8 which may lead to inaccurate A1C results.  ? ?Will continue to follow while inpatient. ? ?Thank you, ?Dulce Sellar, MSN, RN ?Diabetes Coordinator ?Inpatient Diabetes Program ?[phone] (team pager from 8a-5p) ? ? ? ? ? ? ?

## 2021-04-07 NOTE — H&P (View-Only) (Signed)
? ? ? [facility] ?                                                                            ? ? ?Jermaine Nixon, is a 76 y.o. male, DOB - 1945-11-02, FWY:[id number] ?Admit date - 04/06/2021    ?Outpatient Primary MD for the patient is [name], FNP ? ?LOS - 0  days ? ? ? ?Brief summary  ? ?Patient is a 76 year old male with diabetes mellitus type 2, mental disability, BPH, hypertension presented to ED with acute onset of generalized weakness, hypotension and shaking while he was eating.  Patient is fairly poor historian due to mental disability.  He was noted to have mild hypoxia requiring O2 at 3 L and subsequently sats were 95 to 97% on 2 L.  No chest pain or palpitations, fevers or chills. ?Labs showed hyponatremia sodium 131, hyperkalemia 5.6.  Glucose 202, creatinine 1.55.  Creatinine was 0.8 on 02/11/2021.  Lactic acid 4.9 ?WBCs 10.7, troponin 46-47.  EKG had shown inferior lateral T waves.  Chest x-ray showed no acute cardiopulmonary disease. ?Patient was placed on IV fluids, started on broad-spectrum antibiotics. ? ? ?Assessment & Plan  ? ? ?Assessment and Plan: ?*Acute kidney injury with lactic acidosis, hyperkalemia ?-Likely multifactorial due to dehydration, prerenal, hypotension, anemia and medications including HCTZ, benazepril, metformin ?-Continue aggressive IV fluid hydration. ?-Initial lactic acid 4.9 on admission improved to 1.1.  Procalcitonin less than 0.1 ?-D-dimer slightly positive, follow CTA chest ?-Creatinine 1.5 on admission, improved to 1.2, continue IV fluids ? ? ?Dehydration ?-Continue IV fluid hydration. ? ?Hyperkalemia ?-Associated with AKI, patient received D50-insulin, 1 amp of sodium bicarb ?-Potassium now normalized 4.5 ? ?Hyponatremia ?-Sodium 131 on admission, continue IV fluids, improving to 133 today ? ?Mild hypoxemia ?-Unclear etiology, chest x-ray showed no pneumonia.  Procalcitonin less than 0.1 ?-CT abdomen pelvis unremarkable except for moderate stool  burden ?-D-dimer 0.69, slightly positive, will follow CTA ? ?Normocytic anemia ?-Patient denies any bleeding however poor historian, FOBT positive ?-Hemoglobin 12.1 on 02/10/2021, 10.0 on admission, 9.8 today ?-Follow-up anemia profile, GI consulted ?-Started on clear liquid diet, PPI, plan for endoscopy in a.m. ? ?Dyslipidemia ?- Continue statin ? ?Type 2 diabetes mellitus without complications (HCC) ?- Hold Actos, Jardiance, metformin ?-Hemoglobin A1c 9.9, poor control. ?-Continue sliding scale insulin ? ?BPH ?-Continue Flomax ?  ?Moderate protein calorie malnutrition, underweight ?Estimated body mass index is 17.94 kg/m? as calculated from the following: ?  Height as of this encounter: 6' (1.829 m). ?  Weight as of this encounter: 60 kg. ? ?Code Status: Full CODE STATUS ?DVT Prophylaxis:  enoxaparin (LOVENOX) injection 40 mg Start: 04/07/21 1000 ? ? ?Level of Care: Level of care: Telemetry Medical ?Family Communication: Updated patient's niece at the bedside and sister on the phone ? ?Disposition Plan:     Remains inpatient appropriate: Needs further work-up of ongoing issues ? ?Procedures:  ?Consultants:   ?Gastroenterology ? ?Antimicrobials:  ?IV vancomycin 04/06/21-> ?IV cefepime 04/06/2021 -> ? ?Medications ? ? atorvastatin  40 mg Oral Daily  ? empagliflozin  10 mg Oral Daily  ? enoxaparin (LOVENOX) injection  40 mg Subcutaneous Q24H  ? insulin aspart  0-9 Units Subcutaneous  TID AC & HS  ? living well with diabetes book   Does not apply Once  ? pantoprazole  40 mg Oral BID AC  ? pioglitazone  45 mg Oral Daily  ? tamsulosin  0.4 mg Oral QPC breakfast  ? ? ? ?Subjective:  ? ?Jermaine Nixon was seen and examined today.  Patient denies any specific complaints however poor historian due to his mental disability.  Niece at the bedside.  No fevers or chills, no abdominal pain or diarrhea.  Patient states "no" to any pain. ? ?Objective:  ? ?Vitals:  ? 04/07/21 0130 04/07/21 0200 04/07/21 0322 04/07/21 0912  ?BP: 111/63 (!)  120/58 116/69 116/63  ?Pulse: 88 86 81 81  ?Resp: 14 14 18 16   ?Temp: 98.2 ?F (36.8 ?C) 98.2 ?F (36.8 ?C)  97.6 ?F (36.4 ?C)  ?TempSrc: Oral Oral  Oral  ?SpO2: 97% 95% 100% 99%  ?Weight:  60 kg    ?Height:      ? ? ?Intake/Output Summary (Last 24 hours) at 04/07/2021 1327 ?Last data filed at 04/07/2021 06/07/2021 ?Gross per 24 hour  ?Intake 2834.33 ml  ?Output 3150 ml  ?Net -315.67 ml  ? ?Filed Weights  ? 04/06/21 2127 04/07/21 0200  ?Weight: 59.3 kg 60 kg  ? ? ? ?Exam ?General: Alert and awake, mental status close to his baseline ?Cardiovascular: S1 S2 auscultated, no murmurs, RRR ?Respiratory: Clear to auscultation bilaterally, no wheezing ?Gastrointestinal: Soft, nontender, nondistended, + bowel sounds ?Ext: no pedal edema bilaterally ?Neuro: no new deficits, moving all 4 extremities spontaneously ? ? ?Data Reviewed:  I have personally reviewed following labs  ? ? ?CBC ?Lab Results  ?Component Value Date  ? WBC 7.8 04/07/2021  ? RBC 3.29 (L) 04/07/2021  ? HGB 9.8 (L) 04/07/2021  ? HCT 29.6 (L) 04/07/2021  ? MCV 90.0 04/07/2021  ? MCH 29.8 04/07/2021  ? PLT 191 04/07/2021  ? MCHC 33.1 04/07/2021  ? RDW 15.4 04/07/2021  ? LYMPHSABS 1.8 04/06/2021  ? MONOABS 0.7 04/06/2021  ? EOSABS 0.0 04/06/2021  ? BASOSABS 0.0 04/06/2021  ? ? ? ?Last metabolic panel ?Lab Results  ?Component Value Date  ? NA 133 (L) 04/07/2021  ? K 4.5 04/07/2021  ? CL 100 04/07/2021  ? CO2 24 04/07/2021  ? BUN 46 (H) 04/07/2021  ? CREATININE 1.25 (H) 04/07/2021  ? GLUCOSE 170 (H) 04/07/2021  ? GFRNONAA 59 (L) 04/07/2021  ? CALCIUM 9.4 04/07/2021  ? PHOS 3.4 02/10/2021  ? PROT 7.0 04/06/2021  ? ALBUMIN 3.5 04/06/2021  ? BILITOT 0.5 04/06/2021  ? ALKPHOS 53 04/06/2021  ? AST 26 04/06/2021  ? ALT 16 04/06/2021  ? ANIONGAP 9 04/07/2021  ? ? ?CBG (last 3)  ?Recent Labs  ?  04/06/21 ?2126 04/07/21 ?0744 04/07/21 ?1236  ?GLUCAP 216* 141* 209*  ?  ? ? ?Coagulation Profile: ?No results for input(s): INR, PROTIME in the last 168 hours. ? ? ?Radiology Studies: I have  personally reviewed the imaging studies  ?CT Head Wo Contrast ? ?Result Date: 04/06/2021 ?CLINICAL DATA:  Mental status change, unknown cause EXAM: CT HEAD WITHOUT CONTRAST TECHNIQUE: Contiguous axial images were obtained from the base of the skull through the vertex without intravenous contrast. RADIATION DOSE REDUCTION: This exam was performed according to the departmental dose-optimization program which includes automated exposure control, adjustment of the mA and/or kV according to patient size and/or use of iterative reconstruction technique. COMPARISON:  02/09/2021 FINDINGS: Brain: Old lacunar infarcts in the right coronal radii and  bilateral basal ganglia areas There is atrophy and chronic small vessel disease changes. No acute intracranial abnormality. Specifically, no hemorrhage, hydrocephalus, mass lesion, acute infarction, or significant intracranial injury. Vascular: No hyperdense vessel or unexpected calcification. Skull: No acute calvarial abnormality. Sinuses/Orbits: No acute findings Other: None IMPRESSION: Atrophy, chronic microvascular disease. No acute intracranial abnormality. 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 Electronically Signed   By: Kevin  Dover M.D.   On: 04/06/2021 23:56  ? ?CT Angio Chest Pulmonary Embolism (PE) W or WO Contrast ? ?Result Date: 04/07/2021 ?CLINICAL DATA:  Positive D-dimer EXAM: CT ANGIOGRAPHY CHEST WITH CONTRAST TECHNIQUE: Multidetector CT imaging of the chest was performed using the standard protocol during bolus administration of intravenous contrast. Multiplanar CT image reconstructions and MIPs were obtained to evaluate the vascular anatomy. RADIATION DOSE REDUCTION: This exam was performed according to the departmental dose-optimization program which includes automated exposure control, adjustment of the mA and/or kV according to patient size and/or use of iterative reconstruction technique. CONTRAST:  100mL OMNIPAQUE IOHEXOL 350  MG/ML SOLN COMPARISON:  None. FINDINGS: Cardiovascular: Coronary artery calcifications are seen. Heart is enlarged in size. Small pericardial effusion is present. Contrast density in the thoracic aorta is less tha

## 2021-04-07 NOTE — Assessment & Plan Note (Signed)
-   She will be hydrated with IV normal saline and will follow BMP. 

## 2021-04-07 NOTE — Assessment & Plan Note (Signed)
-   This is likely associated with acute kidney injury. ?- He will be given D50 insulin and 1 ampoule of sodium bicarb and potassium level will be followed. ?

## 2021-04-07 NOTE — Assessment & Plan Note (Signed)
-   We will continue statin therapy. 

## 2021-04-07 NOTE — Assessment & Plan Note (Signed)
-   We will continue Actos and Jardiance and Glucotrol XL with p.o. intake. ?- She will be placed on supplement coverage with NovoLog. ?

## 2021-04-07 NOTE — Progress Notes (Signed)
Plan of Care Note for accepted transfer ? ? ?Patient: Jermaine Nixon MRN: [id number]   DOA: 04/06/2021 ? ?Facility requesting transfer: [facility] ED ?Requesting Provider: Dr. Judd Lien ?Reason for transfer: Lactic Acidosis , Hypoxia ?Facility course:  ? ?76 year old male with past medical history of speech impediment, hypertension, non-insulin-dependent diabetes mellitus type 2, benign prostatic hyperplasia who presents to med [facility] emergency department with family with concerns over low blood pressure, generalized weakness and tremors. ? ?Upon evaluation in the emergency department patient was found to have substantial lactic acidosis of 4.9.  Patient was also found to be clinically volume depleted with an elevated BUN of 58 and exhibiting mild hypoxia, currently saturating 92% on 3 L of oxygen via nasal cannula.  Patient was found to have slightly elevated troponins of 46 and 47 although patient was not experiencing any chest pain or dynamic ST segment change.  CT imaging of the abdomen pelvis and head revealed no acute disease. ? ?Of note, patient was recently hospitalized at [facility] in February for substantial lactic acidosis and at that time it was thought to be secondary to metformin use and hypovolemia.  Lactic acidosis resolved prior to discharge. ? ?Currently, ER provider wishes that the patient be hospitalized for volume depletion, recurrent lactic acidosis and ongoing failure to thrive. ? ?Plan of care: ?The patient is accepted for admission to Telemetry unit, at [facility]..  ? ? ?Author: ?Marinda Elk, MD ?04/07/2021 ? ?Check [email or web address] for on-call coverage. ? ?Nursing staff, Please call [facility] Admits & Consults System-Wide number on Amion as soon as patient's arrival, so appropriate admitting provider can evaluate the pt. ?

## 2021-04-07 NOTE — Assessment & Plan Note (Addendum)
-   The patient was admitted to a medical telemetry bed. ?- This like secondary to volume depletion and dehydration. ?- Given his hypoxia however it was initially given IV antibiotics. ?- He has been afebrile without significant leukocytosis. ?- I will add D-dimer to assess his hypoxemia and procalcitonin level. ?- She will be hydrated with IV normal saline and will follow lactic acid level that is currently coming down. ?- At this time I will hold off on further antibiotic therapy. ?

## 2021-04-07 NOTE — Progress Notes (Signed)
New Admission Note:  ? ?Arrival Method:  Stretcher ?Mental Orientation: alert ?Telemetry: box 3 ?Assessment: Completed ?Skin: see flow sheet ?IV: nsl ?Pain: none ?Tubes: none ?Safety Measures: Safety Fall Prevention Plan has been discussed ?Admission: Completed ?[facility] Orientation: Patient has been orientated to the room, unit and staff.  ?Family: sister at bedside -[name] ? ?Orders have been reviewed and implemented. Will continue to monitor the patient. Call light has been placed within reach and bed alarm has been activated.  ? ?Artemio Aly BSN, RN ?Phone number: [phone]  ?

## 2021-04-07 NOTE — Assessment & Plan Note (Addendum)
-   This is likely prerenal due to volume depletion and dehydration as well as hypotension. ?- He will be hydrated with IV normal saline and will follow his BMP. ?- We we will avoid nephrotoxins. ?

## 2021-04-07 NOTE — Assessment & Plan Note (Signed)
-   We will check D-dimer level and if positive will obtain VQ scan or with improvement of creatinine chest CTA. ?- O2 protocol will be followed. ?

## 2021-04-07 NOTE — Progress Notes (Signed)
? ? ? [facility] ?                                                                            ? ? ?Jermaine Nixon, is a 76 y.o. male, DOB - 03-15-1945, XAJ:[id number] ?Admit date - 04/06/2021    ?Outpatient Primary MD for the patient is [name], FNP ? ?LOS - 0  days ? ? ? ?Brief summary  ? ?Patient is a 76 year old male with diabetes mellitus type 2, mental disability, BPH, hypertension presented to ED with acute onset of generalized weakness, hypotension and shaking while he was eating.  Patient is fairly poor historian due to mental disability.  He was noted to have mild hypoxia requiring O2 at 3 L and subsequently sats were 95 to 97% on 2 L.  No chest pain or palpitations, fevers or chills. ?Labs showed hyponatremia sodium 131, hyperkalemia 5.6.  Glucose 202, creatinine 1.55.  Creatinine was 0.8 on 02/11/2021.  Lactic acid 4.9 ?WBCs 10.7, troponin 46-47.  EKG had shown inferior lateral T waves.  Chest x-ray showed no acute cardiopulmonary disease. ?Patient was placed on IV fluids, started on broad-spectrum antibiotics. ? ? ?Assessment & Plan  ? ? ?Assessment and Plan: ?*Acute kidney injury with lactic acidosis, hyperkalemia ?-Likely multifactorial due to dehydration, prerenal, hypotension, anemia and medications including HCTZ, benazepril, metformin ?-Continue aggressive IV fluid hydration. ?-Initial lactic acid 4.9 on admission improved to 1.1.  Procalcitonin less than 0.1 ?-D-dimer slightly positive, follow CTA chest ?-Creatinine 1.5 on admission, improved to 1.2, continue IV fluids ? ? ?Dehydration ?-Continue IV fluid hydration. ? ?Hyperkalemia ?-Associated with AKI, patient received D50-insulin, 1 amp of sodium bicarb ?-Potassium now normalized 4.5 ? ?Hyponatremia ?-Sodium 131 on admission, continue IV fluids, improving to 133 today ? ?Mild hypoxemia ?-Unclear etiology, chest x-ray showed no pneumonia.  Procalcitonin less than 0.1 ?-CT abdomen pelvis unremarkable except for moderate stool  burden ?-D-dimer 0.69, slightly positive, will follow CTA ? ?Normocytic anemia ?-Patient denies any bleeding however poor historian, FOBT positive ?-Hemoglobin 12.1 on 02/10/2021, 10.0 on admission, 9.8 today ?-Follow-up anemia profile, GI consulted ?-Started on clear liquid diet, PPI, plan for endoscopy in a.m. ? ?Dyslipidemia ?- Continue statin ? ?Type 2 diabetes mellitus without complications (HCC) ?- Hold Actos, Jardiance, metformin ?-Hemoglobin A1c 9.9, poor control. ?-Continue sliding scale insulin ? ?BPH ?-Continue Flomax ?  ?Moderate protein calorie malnutrition, underweight ?Estimated body mass index is 17.94 kg/m? as calculated from the following: ?  Height as of this encounter: 6' (1.829 m). ?  Weight as of this encounter: 60 kg. ? ?Code Status: Full CODE STATUS ?DVT Prophylaxis:  enoxaparin (LOVENOX) injection 40 mg Start: 04/07/21 1000 ? ? ?Level of Care: Level of care: Telemetry Medical ?Family Communication: Updated patient's niece at the bedside and sister on the phone ? ?Disposition Plan:     Remains inpatient appropriate: Needs further work-up of ongoing issues ? ?Procedures:  ?Consultants:   ?Gastroenterology ? ?Antimicrobials:  ?IV vancomycin 04/06/21-> ?IV cefepime 04/06/2021 -> ? ?Medications ? ? atorvastatin  40 mg Oral Daily  ? empagliflozin  10 mg Oral Daily  ? enoxaparin (LOVENOX) injection  40 mg Subcutaneous Q24H  ? insulin aspart  0-9 Units Subcutaneous  TID AC & HS  ? living well with diabetes book   Does not apply Once  ? pantoprazole  40 mg Oral BID AC  ? pioglitazone  45 mg Oral Daily  ? tamsulosin  0.4 mg Oral QPC breakfast  ? ? ? ?Subjective:  ? ?Jermaine Nixon was seen and examined today.  Patient denies any specific complaints however poor historian due to his mental disability.  Niece at the bedside.  No fevers or chills, no abdominal pain or diarrhea.  Patient states "no" to any pain. ? ?Objective:  ? ?Vitals:  ? 04/07/21 0130 04/07/21 0200 04/07/21 0322 04/07/21 0912  ?BP: 111/63 (!)  120/58 116/69 116/63  ?Pulse: 88 86 81 81  ?Resp: 14 14 18 16   ?Temp: 98.2 ?F (36.8 ?C) 98.2 ?F (36.8 ?C)  97.6 ?F (36.4 ?C)  ?TempSrc: Oral Oral  Oral  ?SpO2: 97% 95% 100% 99%  ?Weight:  60 kg    ?Height:      ? ? ?Intake/Output Summary (Last 24 hours) at 04/07/2021 1327 ?Last data filed at 04/07/2021 06/07/2021 ?Gross per 24 hour  ?Intake 2834.33 ml  ?Output 3150 ml  ?Net -315.67 ml  ? ?Filed Weights  ? 04/06/21 2127 04/07/21 0200  ?Weight: 59.3 kg 60 kg  ? ? ? ?Exam ?General: Alert and awake, mental status close to his baseline ?Cardiovascular: S1 S2 auscultated, no murmurs, RRR ?Respiratory: Clear to auscultation bilaterally, no wheezing ?Gastrointestinal: Soft, nontender, nondistended, + bowel sounds ?Ext: no pedal edema bilaterally ?Neuro: no new deficits, moving all 4 extremities spontaneously ? ? ?Data Reviewed:  I have personally reviewed following labs  ? ? ?CBC ?Lab Results  ?Component Value Date  ? WBC 7.8 04/07/2021  ? RBC 3.29 (L) 04/07/2021  ? HGB 9.8 (L) 04/07/2021  ? HCT 29.6 (L) 04/07/2021  ? MCV 90.0 04/07/2021  ? MCH 29.8 04/07/2021  ? PLT 191 04/07/2021  ? MCHC 33.1 04/07/2021  ? RDW 15.4 04/07/2021  ? LYMPHSABS 1.8 04/06/2021  ? MONOABS 0.7 04/06/2021  ? EOSABS 0.0 04/06/2021  ? BASOSABS 0.0 04/06/2021  ? ? ? ?Last metabolic panel ?Lab Results  ?Component Value Date  ? NA 133 (L) 04/07/2021  ? K 4.5 04/07/2021  ? CL 100 04/07/2021  ? CO2 24 04/07/2021  ? BUN 46 (H) 04/07/2021  ? CREATININE 1.25 (H) 04/07/2021  ? GLUCOSE 170 (H) 04/07/2021  ? GFRNONAA 59 (L) 04/07/2021  ? CALCIUM 9.4 04/07/2021  ? PHOS 3.4 02/10/2021  ? PROT 7.0 04/06/2021  ? ALBUMIN 3.5 04/06/2021  ? BILITOT 0.5 04/06/2021  ? ALKPHOS 53 04/06/2021  ? AST 26 04/06/2021  ? ALT 16 04/06/2021  ? ANIONGAP 9 04/07/2021  ? ? ?CBG (last 3)  ?Recent Labs  ?  04/06/21 ?2126 04/07/21 ?0744 04/07/21 ?1236  ?GLUCAP 216* 141* 209*  ?  ? ? ?Coagulation Profile: ?No results for input(s): INR, PROTIME in the last 168 hours. ? ? ?Radiology Studies: I have  personally reviewed the imaging studies  ?CT Head Wo Contrast ? ?Result Date: 04/06/2021 ?CLINICAL DATA:  Mental status change, unknown cause EXAM: CT HEAD WITHOUT CONTRAST TECHNIQUE: Contiguous axial images were obtained from the base of the skull through the vertex without intravenous contrast. RADIATION DOSE REDUCTION: This exam was performed according to the departmental dose-optimization program which includes automated exposure control, adjustment of the mA and/or kV according to patient size and/or use of iterative reconstruction technique. COMPARISON:  02/09/2021 FINDINGS: Brain: Old lacunar infarcts in the right coronal radii and  bilateral basal ganglia areas There is atrophy and chronic small vessel disease changes. No acute intracranial abnormality. Specifically, no hemorrhage, hydrocephalus, mass lesion, acute infarction, or significant intracranial injury. Vascular: No hyperdense vessel or unexpected calcification. Skull: No acute calvarial abnormality. Sinuses/Orbits: No acute findings Other: None IMPRESSION: Atrophy, chronic microvascular disease. No acute intracranial abnormality. 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 Electronically Signed   By: Charlett NoseKevin  Dover M.D.   On: 04/06/2021 23:56  ? ?CT Angio Chest Pulmonary Embolism (PE) W or WO Contrast ? ?Result Date: 04/07/2021 ?CLINICAL DATA:  Positive D-dimer EXAM: CT ANGIOGRAPHY CHEST WITH CONTRAST TECHNIQUE: Multidetector CT imaging of the chest was performed using the standard protocol during bolus administration of intravenous contrast. Multiplanar CT image reconstructions and MIPs were obtained to evaluate the vascular anatomy. RADIATION DOSE REDUCTION: This exam was performed according to the departmental dose-optimization program which includes automated exposure control, adjustment of the mA and/or kV according to patient size and/or use of iterative reconstruction technique. CONTRAST:  100mL OMNIPAQUE IOHEXOL 350  MG/ML SOLN COMPARISON:  None. FINDINGS: Cardiovascular: Coronary artery calcifications are seen. Heart is enlarged in size. Small pericardial effusion is present. Contrast density in the thoracic aorta is less tha

## 2021-04-07 NOTE — Evaluation (Signed)
Clinical/Bedside Swallow Evaluation ?Patient Details  ?Name: Jermaine Nixon ?MRN: [id number] ?Date of Birth: [birth year] ? ?Today's Date: 04/07/2021 ?Time: SLP Start Time (ACUTE ONLY): 1045 SLP Stop Time (ACUTE ONLY): 1100 ?SLP Time Calculation (min) (ACUTE ONLY): 15 min ? ?Past Medical History:  ?Past Medical History:  ?Diagnosis Date  ? Diabetes mellitus without complication (HCC)   ? Hypertension   ? Mental disability   ? ?Past Surgical History: History reviewed. No pertinent surgical history. ?HPI:  ?Jermaine Nixon is a 76 y.o. male with a past medical history significant for type 2 diabetes, mental disability, BPH and hypertension, who presented to the ER initially with acute onset of generalized weakness and associated hypotension and shaking with eating.  ?  ?Assessment / Plan / Recommendation  ?Clinical Impression ? Jermaine Nixon was seen at bedside with his niece present for clinical swallow evaluation. Niece endorsed that the pt typically "chokes/coughs" on foods/liquids when he takes too big of bites/sips. Niece also verbalized that the pt typically finger feeds himself foods and drinks via sippy cup. Oral motor exam revealed intact labial and lingual strength and ROM. SLP presented sips of water via straw cup and pt immediately coughed and expectorated orally. SLP then instructed the pt to take individual sips of water, which reducing immediate coughing behaviors. Pt independently demonstrated single straw sips 10x throughout the duration of the session. Across 10 trials, pt did not cough or throat clear. Pt tolerated ~4oz of puree applesauce with no overt s/s of aspiration. SLP branched up to regular texture graham cracker, and pt required mod verbal cues to engage in effective mastication of bolus. Pt was able to masticate and clear oral cavity of novel graham cracker with verbal reinforcements. Pt primarily exhibits coughing/aspiration behaviors in conjunction with impulsivity. At this time, SLP recommends placing  the pt on regular diet with thin liquids. SLP to follow next date during mealtime to monitor for impulsivity, and need for pacing supports or instrumental swallow study. ?SLP Visit Diagnosis: Dysphagia, unspecified (R13.10) ?   ?Aspiration Risk ? Mild aspiration risk  ?  ?Diet Recommendation Regular;Thin liquid  ? ?Liquid Administration via: Straw ?Medication Administration: Whole meds with liquid ?Supervision: Staff to assist with self feeding ?Compensations: Minimize environmental distractions;Slow rate;Small sips/bites ?Postural Changes: Seated upright at 90 degrees  ?  ?Other  Recommendations Oral Care Recommendations: Oral care BID   ? ?Recommendations for follow up therapy are one component of a multi-disciplinary discharge planning process, led by the attending physician.  Recommendations may be updated based on patient status, additional functional criteria and insurance authorization. ? ?Follow up Recommendations Other (comment) (TBD)  ? ? ?  ?Assistance Recommended at Discharge Set up Supervision/Assistance  ?Functional Status Assessment Patient has had a recent decline in their functional status and demonstrates the ability to make significant improvements in function in a reasonable and predictable amount of time.  ?Frequency and Duration min 2x/week  ?2 weeks ?  ?   ? ?Prognosis Prognosis for Safe Diet Advancement: Good  ? ?  ? ?Swallow Study   ?General HPI: Jermaine Nixon is a 76 y.o. male with a past medical history significant for type 2 diabetes, mental disability, BPH and hypertension, who presented to the ER initially with acute onset of generalized weakness and associated hypotension and shaking with eating. ?Type of Study: Bedside Swallow Evaluation ?Previous Swallow Assessment: None ?Diet Prior to this Study: Regular;Thin liquids ?Temperature Spikes Noted: No ?Respiratory Status: Room air ?History of Recent Intubation: No ?Behavior/Cognition: Alert;Cooperative ?Oral Cavity  Assessment: Within  Functional Limits ?Oral Care Completed by SLP: No ?Oral Cavity - Dentition: Missing dentition ?Vision: Functional for self-feeding ?Self-Feeding Abilities: Able to feed self;Needs assist ?Patient Positioning: Upright in bed ?Baseline Vocal Quality: Normal  ?  ?Oral/Motor/Sensory Function Overall Oral Motor/Sensory Function: Within functional limits   ?Ice Chips Ice chips: Not tested   ?Thin Liquid Thin Liquid: Impaired ?Presentation: Straw ?Oral Phase Impairments: Reduced labial seal ?Pharyngeal  Phase Impairments: Cough - Immediate  ?  ?Nectar Thick Nectar Thick Liquid: Not tested   ?Honey Thick Honey Thick Liquid: Not tested   ?Puree Puree: Within functional limits ?Presentation: Spoon   ?Solid ? ? ?  Solid: Impaired ?Oral Phase Impairments: Impaired mastication;Reduced lingual movement/coordination ?Oral Phase Functional Implications: Impaired mastication  ? ?  ? ?Jermaine Nixon ?04/07/2021,11:55 AM ? ? ? ? ?

## 2021-04-07 NOTE — H&P (Signed)
?  ?  ?[place] ? ? ?PATIENT NAME: Jermaine Nixon   ? ?MR#:  [id number] ? ?DATE OF BIRTH:  Oct 10, 1945 ? ?DATE OF ADMISSION:  04/06/2021 ? ?PRIMARY CARE PHYSICIAN: Saintclair Halsted, FNP  ? ?Patient is coming from: Home ? ?REQUESTING/REFERRING PHYSICIAN: Veryl Speak, MD ([facility] ED) ? ?CHIEF COMPLAINT:  ? ?Chief Complaint  ?Patient presents with  ?? Weakness  ? ? ?HISTORY OF PRESENT ILLNESS:  ?Jermaine Nixon is a 76 y.o. African-American male with medical history significant for type 2 diabetes mellitus, mental disability, BPH and hypertension, who presented to the emergency room with acute onset of generalized weakness with associated hypotension and shaking while he was eating.  The patient denied any cough or dyspnea or wheezing however he is a fairly poor historian due to mental disability.  He was having mild hypoxia requiring O2 at 3 L/min by nasal cannula and subsequent pulse symmetry of 92% later on it was up to 95-97% on 2 L.  He did not have any chest pain or palpitations.  No fever or chills.  No reported dysuria, oliguria or hematuria, urgency or frequency or flank pain. ? ?ED Course: Upon presentation to the emergency room, vital signs were within initially and later was 91% on room air as mentioned above.  Later Pulsoxymeter was 97% on 2 L of O2 by nasal cannula.  Labs revealed hyponatremia 131 and hypochloremia of 92 with hyperkalemia of 5.6.  Glucose was 202 and BUN 58 with creatinine 1.55 compared to 22/0.89 on 02/11/2021.  Lactic acid was 4.9 and later 3.9.  High-sensitivity troponin I was 46 and later 47.  CBC showed WBC of 10.7 with neutrophilia and anemia slightly worse than previous levels. ?EKG as reviewed by me : EKG showed normal sinus rhythm with a rate of 91 with right atrial enlargement and left bundle branch block with T wave inversion inferolaterally. ?Imaging: Portable chest x-ray showed no acute cardiopulmonary disease. ? ?The patient was given IV cefepime, vancomycin Flagyl and  1.8 L of IV lactated Ringer followed 150 mL/h.  He is directly admitted to a medical telemetry bed for further evaluation and management. ?PAST MEDICAL HISTORY:  ? ?Past Medical History:  ?Diagnosis Date  ?? Diabetes mellitus without complication ([place])   ?? Hypertension   ?? Mental disability   ? ? ?PAST SURGICAL HISTORY:  ?History reviewed. No pertinent surgical history.  No reported previous surgeries. ? ?SOCIAL HISTORY:  ? ?Social History  ? ?Tobacco Use  ?? Smoking status: Never  ?? Smokeless tobacco: Never  ?Substance Use Topics  ?? Alcohol use: Never  ? ? ?FAMILY HISTORY:  ? ?Family History  ?Problem Relation Age of Onset  ?? Hypertension Other   ?? Diabetes Other   ? ? ?DRUG ALLERGIES:  ?No Known Allergies ? ?REVIEW OF SYSTEMS:  ? ?ROS ?As per history of present illness. All pertinent systems were reviewed above. Constitutional, HEENT, cardiovascular, respiratory, GI, GU, musculoskeletal, neuro, psychiatric, endocrine, integumentary and hematologic systems were reviewed and are otherwise negative/unremarkable except for positive findings mentioned above in the HPI. ? ? ?MEDICATIONS AT HOME:  ? ?Prior to Admission medications   ?Medication Sig Start Date End Date Taking? Authorizing Provider  ?aspirin EC 81 MG tablet Take 81 mg by mouth daily as needed for moderate pain. 05/12/10   [provider]  ?Assure Comfort Lancets 28G MISC Frequency:   Dosage:0     Instructions:  Note:check glucose daily 10/22/09   [provider]  ?atorvastatin (  LIPITOR) 40 MG tablet Take 40 mg by mouth daily. 01/28/21   [provider]  ?benazepril (LOTENSIN) 20 MG tablet Take 20 mg by mouth daily. 05/12/10   [provider]  ?Blood Glucose Monitoring Suppl (FIFTY50 GLUCOSE METER 2.0) w/Device KIT Frequency:   Dosage:0     Instructions:  Note:check blood sugar daily 10/22/09   [provider]  ?glucose blood (PRODIGY NO CODING BLOOD GLUC) test strip Frequency:Daily   Dosage:1      Instructions:  Note:TEST ONCE DAILY. 10/22/09   [provider]  ?hydrochlorothiazide (HYDRODIURIL) 25 MG tablet Take 25 mg by mouth daily. 05/12/10   [provider]  ?JARDIANCE 10 MG TABS tablet Take 1 tablet (10 mg total) by mouth daily. Hold for now, please follow up with your pcp to discuss resumption  this medication 02/11/21   Florencia Reasons, MD  ?metFORMIN (GLUCOPHAGE) 500 MG tablet Take 500 mg by mouth 2 (two) times daily with a meal. 05/12/10   [provider]  ?pioglitazone (ACTOS) 45 MG tablet Take 45 mg by mouth daily. 05/12/10   [provider]  ?tamsulosin (FLOMAX) 0.4 MG CAPS capsule Take 0.4 mg by mouth daily after breakfast.    [provider]  ? ?  ? ?VITAL SIGNS:  ?Blood pressure 116/69, pulse 81, temperature 98.2 ?F (36.8 ?C), temperature source Oral, resp. rate 18, height 6' (1.829 m), weight 60 kg, SpO2 100 %. ? ?PHYSICAL EXAMINATION:  ?Physical Exam ? ?GENERAL:  76 y.o.-year-old African-American patient lying in the bed with no acute distress.  ?EYES: Pupils equal, round, reactive to light and accommodation. No scleral icterus. Extraocular muscles intact.  ?HEENT: Head atraumatic, normocephalic. Oropharynx and nasopharynx clear.  ?NECK:  Supple, no jugular venous distention. No thyroid enlargement, no tenderness.  ?LUNGS: Normal breath sounds bilaterally, no wheezing, rales, rhonchi or crepitation. No use of accessory muscles of respiration.  ?CARDIOVASCULAR: Regular rate and rhythm, S1, S2 normal. No murmurs, rubs, or gallops.  ?ABDOMEN: Soft, nondistended, nontender. Bowel sounds present. No organomegaly or mass.  ?EXTREMITIES: No pedal edema, cyanosis, or clubbing.  ?NEUROLOGIC: Cranial nerves II through XII are intact. Muscle strength 5/5 in all extremities. Sensation intact. Gait not checked.  ?PSYCHIATRIC: The patient is alert and oriented x 3.  Normal affect and good eye contact. ?SKIN: No obvious rash, lesion, or ulcer.  ? ?LABORATORY PANEL:   ? ?CBC ?Recent Labs  ?Lab 04/06/21 ?2221  ?WBC 10.7*  ?HGB 10.0*  ?HCT 29.0*  ?PLT 234  ? ?------------------------------------------------------------------------------------------------------------------ ? ?Chemistries  ?Recent Labs  ?Lab 04/06/21 ?2221  ?NA 131*  ?K 5.6*  ?CL 92*  ?CO2 26  ?GLUCOSE 202*  ?BUN 58*  ?CREATININE 1.55*  ?CALCIUM 9.6  ?AST 26  ?ALT 16  ?ALKPHOS 53  ?BILITOT 0.5  ? ?------------------------------------------------------------------------------------------------------------------ ? ?Cardiac Enzymes ?No results for input(s): TROPONINI in the last 168 hours. ?------------------------------------------------------------------------------------------------------------------ ? ?RADIOLOGY:  ?CT Head Wo Contrast ? ?Result Date: 04/06/2021 ?CLINICAL DATA:  Mental status change, unknown cause EXAM: CT HEAD WITHOUT CONTRAST TECHNIQUE: Contiguous axial images were obtained from the base of the skull through the vertex without intravenous contrast. RADIATION DOSE REDUCTION: This exam was performed according to the departmental dose-optimization program which includes automated exposure control, adjustment of the mA and/or kV according to patient size and/or use of iterative reconstruction technique. 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 No acute intracranial abnormality.  Specifically, no hemorrhage, hydrocephalus, mass lesion, acute infarction, or significant intracranial injury. Vascular: No hyperdense vessel or unexpected calcification. Skull: No acute calvarial abnormality. Sinuses/Orbits: No acute findings Other: None IMPRESSION: Atrophy, chronic microvascular disease. No acute intracranial abnormality. 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 Electronically Signed   By: Rolm Baptise M.D.   On: 04/06/2021 23:56  ? ?CT  Abdomen Pelvis W Contrast ? ?Result Date: 04/06/2021 ?CLINICAL DATA:  Sepsis, weakness EXAM: CT ABDOMEN AND PELVIS WITH CONTRAST TECHNIQUE: Multidetector CT imaging of the abdomen and pelvis was performed using the standard prot

## 2021-04-07 NOTE — ED Notes (Signed)
Provider aware of decreased SPO2. Nasal canula applied at 3L. ?

## 2021-04-08 ENCOUNTER — Encounter (HOSPITAL_COMMUNITY): Payer: Self-pay | Admitting: Internal Medicine

## 2021-04-08 ENCOUNTER — Encounter (HOSPITAL_COMMUNITY)
Admission: EM | Disposition: A | Discharge status: Skilled Nursing Facility | Payer: Self-pay | Source: Home / Self Care | Attending: Internal Medicine

## 2021-04-08 ENCOUNTER — Inpatient Hospital Stay (HOSPITAL_COMMUNITY): Payer: Medicare Other | Admitting: Anesthesiology

## 2021-04-08 DIAGNOSIS — I10 Essential (primary) hypertension: Secondary | ICD-10-CM

## 2021-04-08 DIAGNOSIS — N179 Acute kidney failure, unspecified: Secondary | ICD-10-CM

## 2021-04-08 DIAGNOSIS — T182XXA Foreign body in stomach, initial encounter: Secondary | ICD-10-CM

## 2021-04-08 DIAGNOSIS — E872 Acidosis, unspecified: Secondary | ICD-10-CM | POA: Diagnosis not present

## 2021-04-08 DIAGNOSIS — Z794 Long term (current) use of insulin: Secondary | ICD-10-CM

## 2021-04-08 DIAGNOSIS — E785 Hyperlipidemia, unspecified: Secondary | ICD-10-CM | POA: Diagnosis not present

## 2021-04-08 DIAGNOSIS — E119 Type 2 diabetes mellitus without complications: Secondary | ICD-10-CM

## 2021-04-08 DIAGNOSIS — K921 Melena: Secondary | ICD-10-CM

## 2021-04-08 DIAGNOSIS — E860 Dehydration: Secondary | ICD-10-CM | POA: Diagnosis not present

## 2021-04-08 DIAGNOSIS — D509 Iron deficiency anemia, unspecified: Secondary | ICD-10-CM

## 2021-04-08 HISTORY — PX: FLEXIBLE SIGMOIDOSCOPY: SHX5431

## 2021-04-08 HISTORY — PX: FOREIGN BODY REMOVAL: SHX962

## 2021-04-08 HISTORY — PX: ESOPHAGOGASTRODUODENOSCOPY (EGD) WITH PROPOFOL: SHX5813

## 2021-04-08 LAB — BASIC METABOLIC PANEL
Anion gap: 7 (ref 5–15)
BUN: 20 mg/dL (ref 8–23)
CO2: 27 mmol/L (ref 22–32)
Calcium: 9 mg/dL (ref 8.9–10.3)
Chloride: 107 mmol/L (ref 98–111)
Creatinine, Ser: 0.85 mg/dL (ref 0.61–1.24)
GFR, Estimated: >60 mL/min (ref >60)
Glucose, Bld: 136 mg/dL — ABNORMAL HIGH (ref 70–99)
Potassium: 4 mmol/L (ref 3.5–5.1)
Sodium: 141 mmol/L (ref 135–145)

## 2021-04-08 LAB — CBC
HCT: 27.4 % — ABNORMAL LOW (ref 39.0–52.0)
Hemoglobin: 9.3 g/dL — ABNORMAL LOW (ref 13.0–17.0)
MCH: 30.3 pg (ref 26.0–34.0)
MCHC: 33.9 g/dL (ref 30.0–36.0)
MCV: 89.3 fL (ref 80.0–100.0)
Platelets: 218 10*3/uL (ref 150–400)
RBC: 3.07 MIL/uL — ABNORMAL LOW (ref 4.22–5.81)
RDW: 15.5 % (ref 11.5–15.5)
WBC: 6.2 10*3/uL (ref 4.0–10.5)
nRBC: 0 % (ref 0.0–0.2)

## 2021-04-08 LAB — GLUCOSE, CAPILLARY
Glucose-Capillary: 140 mg/dL — ABNORMAL HIGH (ref 70–99)
Glucose-Capillary: 140 mg/dL — ABNORMAL HIGH (ref 70–99)
Glucose-Capillary: 244 mg/dL — ABNORMAL HIGH (ref 70–99)
Glucose-Capillary: 202 mg/dL — ABNORMAL HIGH (ref 70–99)

## 2021-04-08 SURGERY — ESOPHAGOGASTRODUODENOSCOPY (EGD) WITH PROPOFOL
Anesthesia: Monitor Anesthesia Care

## 2021-04-08 MED ORDER — PHENYLEPHRINE HCL-NACL 20-0.9 MG/250ML-% IV SOLN
INTRAVENOUS | Status: DC | PRN
  Administered 2021-04-08: 50 ug/min via INTRAVENOUS

## 2021-04-08 MED ORDER — PROPOFOL 10 MG/ML IV BOLUS
INTRAVENOUS | Status: DC | PRN
  Administered 2021-04-08 (×2): 25 mg via INTRAVENOUS
  Administered 2021-04-08: 30 mg via INTRAVENOUS

## 2021-04-08 MED ORDER — SUCCINYLCHOLINE CHLORIDE 200 MG/10ML IV SOSY
PREFILLED_SYRINGE | INTRAVENOUS | Status: DC | PRN
  Administered 2021-04-08: 120 mg via INTRAVENOUS

## 2021-04-08 MED ORDER — PHENYLEPHRINE 40 MCG/ML (10ML) SYRINGE FOR IV PUSH (FOR BLOOD PRESSURE SUPPORT)
PREFILLED_SYRINGE | INTRAVENOUS | Status: DC | PRN
Start: 2021-04-08 — End: 2021-04-08
  Administered 2021-04-08 (×3): 120 ug via INTRAVENOUS

## 2021-04-08 MED ORDER — PROPOFOL 500 MG/50ML IV EMUL
INTRAVENOUS | Status: DC | PRN
  Administered 2021-04-08: 115 ug/kg/min via INTRAVENOUS

## 2021-04-08 MED ORDER — LACTATED RINGERS IV SOLN: INTRAVENOUS | Status: DC

## 2021-04-08 MED ORDER — SODIUM CHLORIDE 0.9 % IV SOLN: INTRAVENOUS | Status: DC

## 2021-04-08 MED ORDER — ROCURONIUM BROMIDE 100 MG/10ML IV SOLN
INTRAVENOUS | Status: DC | PRN
Start: 2021-04-08 — End: 2021-04-08
  Administered 2021-04-08: 20 mg via INTRAVENOUS

## 2021-04-08 MED ORDER — EPHEDRINE SULFATE-NACL 50-0.9 MG/10ML-% IV SOSY
PREFILLED_SYRINGE | INTRAVENOUS | Status: DC | PRN
  Administered 2021-04-08: 5 mg via INTRAVENOUS

## 2021-04-08 MED ORDER — LIDOCAINE 2% (20 MG/ML) 5 ML SYRINGE
INTRAMUSCULAR | Status: DC | PRN
  Administered 2021-04-08: 40 mg via INTRAVENOUS

## 2021-04-08 MED ORDER — SUGAMMADEX SODIUM 200 MG/2ML IV SOLN
INTRAVENOUS | Status: DC | PRN
  Administered 2021-04-08: 200 mg via INTRAVENOUS

## 2021-04-08 SURGICAL SUPPLY — 25 items

## 2021-04-08 NOTE — Addendum Note (Signed)
Addendum  created 04/08/21 1309 by Heather Roberts, MD  ? Clinical Note Signed  ?  ?

## 2021-04-08 NOTE — Progress Notes (Signed)
SLP Cancellation Note ? ?Patient Details ?Name: Jermaine Nixon ?MRN: [id number] ?DOB: 08-Sep-1945 ? ? ?Cancelled treatment:        Pt is NPO for colonoscopy. Will continue efforts. ? ? ?Houston Siren ?04/08/2021, 11:48 AM ? ?

## 2021-04-08 NOTE — Anesthesia Preprocedure Evaluation (Addendum)
Anesthesia Evaluation  ?Patient identified by MRN, date of birth, ID band ?Patient awake ? ? ? ?Reviewed: ?Allergy & Precautions, NPO status , Patient's Chart, lab work & pertinent test results ? ?History of Anesthesia Complications ?Negative for: history of anesthetic complications ? ?Airway ?Mallampati: III ? ?TM Distance: >3 FB ?Neck ROM: Full ? ? ? Dental ? ?(+) Poor Dentition, Dental Advisory Given ?  ?Pulmonary ?neg pulmonary ROS,  ?  ?Pulmonary exam normal ? ? ? ? ? ? ? Cardiovascular ?hypertension, Pt. on medications ?Normal cardiovascular exam ? ? ?  ?Neuro/Psych ?Mental disability ?negative neurological ROS ?   ? GI/Hepatic ?negative GI ROS, Neg liver ROS,   ?Endo/Other  ?diabetes ? Renal/GU ?negative Renal ROS  ? ?  ?Musculoskeletal ?negative musculoskeletal ROS ?(+)  ? Abdominal ?  ?Peds ? Hematology ? ?(+) Blood dyscrasia, anemia ,   ?Anesthesia Other Findings ? ? Reproductive/Obstetrics ? ?  ? ? ? ? ? ? ? ? ? ? ? ? ? ?  ?  ? ? ? ? ? ? ? ?Anesthesia Physical ?Anesthesia Plan ? ?ASA: 2 ? ?Anesthesia Plan: MAC  ? ?Post-op Pain Management: Minimal or no pain anticipated  ? ?Induction:  ? ?PONV Risk Score and Plan: Ondansetron and Propofol infusion ? ?Airway Management Planned: Natural Airway ? ?Additional Equipment:  ? ?Intra-op Plan:  ? ?Post-operative Plan:  ? ?Informed Consent: I have reviewed the patients History and Physical, chart, labs and discussed the procedure including the risks, benefits and alternatives for the proposed anesthesia with the patient or authorized representative who has indicated his/her understanding and acceptance.  ? ? ? ?Dental advisory given ? ?Plan Discussed with: Anesthesiologist and CRNA ? ?Anesthesia Plan Comments:   ? ? ? ? ? ?Anesthesia Quick Evaluation ? ?

## 2021-04-08 NOTE — Anesthesia Postprocedure Evaluation (Deleted)
Anesthesia Post Note ? ?Patient: Jermaine Nixon ? ?Procedure(s) Performed: ESOPHAGOGASTRODUODENOSCOPY (EGD) WITH PROPOFOL ?FOREIGN BODY REMOVAL ?FLEXIBLE SIGMOIDOSCOPY ? ?  ? ?Patient location during evaluation: PACU ?Anesthesia Type: General ?Level of consciousness: sedated ?Pain management: pain level controlled ?Vital Signs Assessment: post-procedure vital signs reviewed and stable ?Respiratory status: spontaneous breathing and respiratory function stable ?Cardiovascular status: stable ?Postop Assessment: no apparent nausea or vomiting ?Anesthetic complications: no ? ? ?No notable events documented. ? ?Last Vitals:  ?Vitals:  ? 04/08/21 1216 04/08/21 1226  ?BP: (!) 118/53 (!) 135/55  ?Pulse: 84 85  ?Resp: 18 18  ?Temp: 36.6 ?C   ?SpO2: 96% 96%  ?  ?Last Pain:  ?Vitals:  ? 04/08/21 1216  ?TempSrc: Temporal  ?PainSc:   ? ? ?  ?  ?  ?  ?  ?  ? ?Osha Rane,Bran DANIEL ? ? ? ? ?

## 2021-04-08 NOTE — Interval H&P Note (Signed)
History and Physical Interval Note: ? ?04/08/2021 ?11:09 AM ? ?[name]  has presented today for surgery, with the diagnosis of aermia.  The various methods of treatment have been discussed with the patient and family. After consideration of risks, benefits and other options for treatment, the patient has consented to  Procedure(s): ?ESOPHAGOGASTRODUODENOSCOPY (EGD) WITH PROPOFOL (N/A) ?COLONOSCOPY WITH PROPOFOL (N/A) as a surgical intervention.  The patient's history has been reviewed, patient examined, no change in status, stable for surgery.  I have reviewed the patient's chart and labs.  Questions were answered to the patient's satisfaction.   ? ? ?Jermaine Nixon ? ? ?

## 2021-04-08 NOTE — Transfer of Care (Signed)
Immediate Anesthesia Transfer of Care Note ? ?Patient: Jermaine Nixon ? ?Procedure(s) Performed: ESOPHAGOGASTRODUODENOSCOPY (EGD) WITH PROPOFOL ?FOREIGN BODY REMOVAL ?FLEXIBLE SIGMOIDOSCOPY ? ?Patient Location: PACU ? ?Anesthesia Type:General ? ?Level of Consciousness: awake and patient cooperative ? ?Airway & Oxygen Therapy: Patient Spontanous Breathing ? ?Post-op Assessment: Report given to RN, Post -op Vital signs reviewed and stable and Patient moving all extremities X 4 ? ?Post vital signs: Reviewed and stable ? ?Last Vitals:  ?Vitals Value Taken Time  ?BP 118/53 04/08/21 1215  ?Temp    ?Pulse 77 04/08/21 1216  ?Resp 18 04/08/21 1216  ?SpO2 95 % 04/08/21 1216  ?Vitals shown include unvalidated device data. ? ?Last Pain:  ?Vitals:  ? 04/08/21 1038  ?TempSrc: Temporal  ?PainSc: 0-No pain  ?   ? ?  ? ?Complications: No notable events documented. ?

## 2021-04-08 NOTE — Anesthesia Postprocedure Evaluation (Signed)
Anesthesia Post Note ? ?Patient: Jermaine Nixon ? ?Procedure(s) Performed: ESOPHAGOGASTRODUODENOSCOPY (EGD) WITH PROPOFOL ?FOREIGN BODY REMOVAL ?FLEXIBLE SIGMOIDOSCOPY ? ?  ? ?Patient location during evaluation: Endoscopy ?Anesthesia Type: General ?Level of consciousness: sedated ?Pain management: pain level controlled ?Vital Signs Assessment: post-procedure vital signs reviewed and stable ?Respiratory status: spontaneous breathing and respiratory function stable ?Cardiovascular status: stable ?Postop Assessment: no apparent nausea or vomiting ?Anesthetic complications: no ? ? ?No notable events documented. ? ?Last Vitals:  ?Vitals:  ? 04/08/21 1236 04/08/21 1301  ?BP: 138/61 131/72  ?Pulse: 79 78  ?Resp: 18 16  ?Temp:  (!) 36.2 ?C  ?SpO2: 96% 96%  ?  ?Last Pain:  ?Vitals:  ? 04/08/21 1216  ?TempSrc: Temporal  ?PainSc:   ? ? ?  ?  ?  ?  ?  ?  ? ?Mihir Flanigan,[name] ? ? ? ? ?

## 2021-04-08 NOTE — Progress Notes (Signed)
? ? ? [facility] ?                                                                            ? ? ?Jermaine HongJames Nixon, is a 76 y.o. male, DOB - July 10, 1945, ZOX:[id number] ?Admit date - 04/06/2021    ?Outpatient Primary MD for the patient is Camie PatienceHayes, Romana S, FNP ? ?LOS - 1  days ? ? ? ?Brief summary  ? ?Patient is a 76 year old male with diabetes mellitus type 2, mental disability, BPH, hypertension presented to ED with acute onset of generalized weakness, hypotension and shaking while he was eating.  Patient is fairly poor historian due to mental disability.  He was noted to have mild hypoxia requiring O2 at 3 L and subsequently sats were 95 to 97% on 2 L.  No chest pain or palpitations, fevers or chills. ?Labs showed hyponatremia sodium 131, hyperkalemia 5.6.  Glucose 202, creatinine 1.55.  Creatinine was 0.8 on 02/11/2021.  Lactic acid 4.9 ?WBCs 10.7, troponin 46-47.  EKG had shown inferior lateral T waves.  Chest x-ray showed no acute cardiopulmonary disease. ?Patient was placed on IV fluids, started on broad-spectrum antibiotics. ? ? ?Assessment & Plan  ? ? ?Assessment and Plan: ?*Acute kidney injury with lactic acidosis, hyperkalemia ?-Likely multifactorial due to dehydration, prerenal, hypotension, anemia and medications including HCTZ, benazepril, metformin ?-Initial lactic acid 4.9 on admission improved to 1.1.  Procalcitonin less than 0.1 ?-D-dimer slightly positive, CTA chest negative for any PE ?-Creatinine 1.5 on admission, improved with IV fluid hydration, creatinine 0.8 ? ? ?Dehydration ?-Continue IV fluid hydration. ? ?Hyperkalemia ?-Associated with AKI, patient received D50-insulin, 1 amp of sodium bicarb ?-Now improved to 4.0 ? ?Hyponatremia ?-Sodium 131 on admission, received IV fluid hydration ?-Improved sodium 141 ? ?Mild hypoxemia ?-Unclear etiology, chest x-ray showed no pneumonia.  Procalcitonin less than 0.1 ?-CT abdomen pelvis unremarkable except for moderate stool burden ?-D-dimer 0.69, CT  angiogram chest showed no PE, patchy infiltrates in the posterior lower lung field suggesting atelectasis/pneumonia. ?-Currently on IV antibiotics ? ? ?Normocytic anemia ?-Patient denies any bleeding however poor historian, FOBT positive ?-Hemoglobin 12.1 on 02/10/2021, 10.0 on admission, ?-Underwent EGD, colonoscopy. ?-EGD showed normal esophagus, GEJ, large foreign body in the stomach, successfully removed.  Some bleeding from the esophagus while pulling the foreign body out, normal duodenum. ?-GI recommended clear liquid diet today, advance as tolerated in a.m. ?-Colonoscopy aborted due to poor prep ? ? ?Dyslipidemia ?- Continue statin ? ?Type 2 diabetes mellitus without complications (HCC) ?- Hold Actos, Jardiance, metformin ?-Hemoglobin A1c 9.9, poor control. ?-Continue sliding scale insulin ? ?BPH ?-Continue Flomax ?  ?Moderate protein calorie malnutrition, underweight ?Estimated body mass index is 17.94 kg/m? as calculated from the following: ?  Height as of this encounter: 6' (1.829 m). ?  Weight as of this encounter: 60 kg. ? ?Code Status: Full CODE STATUS ?DVT Prophylaxis:  enoxaparin (LOVENOX) injection 40 mg Start: 04/08/21 1000 ? ? ?Level of Care: Level of care: Telemetry Medical ?Family Communication: Updated patient's niece at the bedside and sister on the phone on 4/6 ? ?Disposition Plan:     Remains inpatient appropriate: Hopefully DC tomorrow ? ?Procedures:  ?Consultants:   ?Gastroenterology ? ?Antimicrobials:  ?  IV vancomycin 04/06/21-> ?IV cefepime 04/06/2021 -> ? ?Medications ? ? atorvastatin  40 mg Oral Daily  ? enoxaparin (LOVENOX) injection  40 mg Subcutaneous Q24H  ? insulin aspart  0-9 Units Subcutaneous TID AC & HS  ? mouth rinse  15 mL Mouth Rinse BID  ? pantoprazole  40 mg Oral BID AC  ? tamsulosin  0.4 mg Oral QPC breakfast  ? ? ? ?Subjective:  ? ?Jermaine Nixon was seen and examined today.  No complaints with the patient, poor historian due to mental disability, difficult to obtain review of  system.  Does not appear to be in any pain, no fevers or chills, no nausea vomiting..  ? ?Objective:  ? ?Vitals:  ? 04/08/21 1216 04/08/21 1226 04/08/21 1236 04/08/21 1301  ?BP: (!) 118/53 (!) 135/55 138/61 131/72  ?Pulse: 84 85 79 78  ?Resp: 18 18 18 16   ?Temp: 97.9 ?F (36.6 ?C)   (!) 97.2 ?F (36.2 ?C)  ?TempSrc: Temporal     ?SpO2: 96% 96% 96% 96%  ?Weight:      ?Height:      ? ? ?Intake/Output Summary (Last 24 hours) at 04/08/2021 1312 ?Last data filed at 04/08/2021 1211 ?Gross per 24 hour  ?Intake 3395.44 ml  ?Output 4350 ml  ?Net -954.56 ml  ? ?Filed Weights  ? 04/06/21 2127 04/07/21 0200  ?Weight: 59.3 kg 60 kg  ? ?Physical Exam ?General: Alert and oriented x 3, NAD ?Cardiovascular: S1 S2 clear, RRR. No pedal edema b/l ?Respiratory: CTAB ?Gastrointestinal: Soft, nontender, nondistended, NBS ?Ext: no pedal edema bilaterally ?Neuro: no new deficits, moving all 4 extremities spontaneously ? ? ?Data Reviewed:  I have personally reviewed following labs  ? ? ?CBC ?Lab Results  ?Component Value Date  ? WBC 6.2 04/08/2021  ? RBC 3.07 (L) 04/08/2021  ? HGB 9.3 (L) 04/08/2021  ? HCT 27.4 (L) 04/08/2021  ? MCV 89.3 04/08/2021  ? MCH 30.3 04/08/2021  ? PLT 218 04/08/2021  ? MCHC 33.9 04/08/2021  ? RDW 15.5 04/08/2021  ? LYMPHSABS 1.8 04/06/2021  ? MONOABS 0.7 04/06/2021  ? EOSABS 0.0 04/06/2021  ? BASOSABS 0.0 04/06/2021  ? ? ? ?Last metabolic panel ?Lab Results  ?Component Value Date  ? NA 141 04/08/2021  ? K 4.0 04/08/2021  ? CL 107 04/08/2021  ? CO2 27 04/08/2021  ? BUN 20 04/08/2021  ? CREATININE 0.85 04/08/2021  ? GLUCOSE 136 (H) 04/08/2021  ? GFRNONAA >60 04/08/2021  ? CALCIUM 9.0 04/08/2021  ? PHOS 3.4 02/10/2021  ? PROT 7.0 04/06/2021  ? ALBUMIN 3.5 04/06/2021  ? BILITOT 0.5 04/06/2021  ? ALKPHOS 53 04/06/2021  ? AST 26 04/06/2021  ? ALT 16 04/06/2021  ? ANIONGAP 7 04/08/2021  ? ? ?CBG (last 3)  ?Recent Labs  ?  04/07/21 ?1657 04/07/21 ?2026 04/08/21 ?0717  ?GLUCAP 87 143* 140*  ?  ? ? ?Coagulation Profile: ?No  results for input(s): INR, PROTIME in the last 168 hours. ? ? ?Radiology Studies: I have personally reviewed the imaging studies  ?CT Head Wo Contrast ? ?Result Date: 04/06/2021 ?CLINICAL DATA:  Mental status change, unknown cause EXAM: CT HEAD WITHOUT CONTRAST TECHNIQUE: Contiguous axial images were obtained from the base of the skull through the vertex without intravenous contrast. RADIATION DOSE REDUCTION: This exam was performed according to the departmental dose-optimization program which includes automated exposure control, adjustment of the mA and/or kV according to patient size and/or use of iterative reconstruction technique. COMPARISON:  02/09/2021 FINDINGS: Brain: Old  lacunar infarcts in the right coronal radii and bilateral basal ganglia areas There is atrophy and chronic small vessel disease changes. No acute intracranial abnormality. Specifically, no hemorrhage, hydrocephalus, mass lesion, acute infarction, or significant intracranial injury. Vascular: No hyperdense vessel or unexpected calcification. Skull: No acute calvarial abnormality. Sinuses/Orbits: No acute findings Other: None IMPRESSION: Atrophy, chronic microvascular disease. No acute intracranial abnormality. 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 Electronically Signed   By: Charlett Nose M.D.   On: 04/06/2021 23:56  ? ?CT Angio Chest Pulmonary Embolism (PE) W or WO Contrast ? ?Result Date: 04/07/2021 ?CLINICAL DATA:  Positive D-dimer EXAM: CT ANGIOGRAPHY CHEST WITH CONTRAST TECHNIQUE: Multidetector CT imaging of the chest was performed using the standard protocol during bolus administration of intravenous contrast. Multiplanar CT image reconstructions and MIPs were obtained to evaluate the vascular anatomy. RADIATION DOSE REDUCTION: This exam was performed according to the departmental dose-optimization program which includes automated exposure control, adjustment of the mA and/or kV according to patient size  and/or use of iterative reconstruction technique. CONTRAST:  OMNIPAQUE IOHEXOL 350 MG/ML SOLN COMPARISON:  None. FINDINGS: Cardiovascular: Coronary artery calcifications are seen. Heart is enlarged in size.

## 2021-04-08 NOTE — Addendum Note (Signed)
Addendum  created 04/08/21 1230 by Heather Roberts, MD  ? Delete clinical note  ?  ?

## 2021-04-08 NOTE — Op Note (Signed)
[facility] ?Patient Name: Jermaine Nixon ?Procedure Date : 04/08/2021 ?MRN: [id number] ?Attending MD: Juanita Craver , MD ?Date of Birth: December 01, 1945 ?CSN: [id number] ?Age: 76 ?Admit Type: Outpatient ?Procedure:                Colonoscopy changed to a flexible sigmoidoscopy. ?Indications:              Iron deficiecny anemia; Guaiac positive stools. ?Providers:                Juanita Craver, MD, Carmie End, RN, Alphonzo Grieve  ?                          Leighton Roach, Technician, Junie Bame, CRNA, Herma Mering,  ?                          MD ?Referring MD:             Jermaine Nixon ?Medicines:                Monitored Anesthesia Care ?Complications:            No immediate complications. ?Estimated Blood Loss:     Estimated blood loss: none. ?Procedure:                Pre-Anesthesia Assessment: - Prior to the  ?                          procedure, a history and physical was performed,  ?                          and patient medications and allergies were  ?                          reviewed. The patient's tolerance of previous  ?                          anesthesia was also reviewed. The risks and  ?                          benefits of the procedure and the sedation options  ?                          and risks were discussed with the patient's siter,  ?                          Jermaine Nixon-this was done by the GI teakm  ?                          covering the service yesterday. All questions were  ?                          answered, and informed consent was obtained. Prior  ?                          Anticoagulants: The patient has taken no previous  ?  anticoagulant or antiplatelet agents. ASA Grade  ?                          Assessment: III - A patient with severe systemic  ?                          disease. After reviewing the risks and benefits,  ?                          the patient was deemed in satisfactory condition to  ?                          undergo the procedure. After obtaining  informed  ?                          consent, the scope was passed under direct vision.  ?                          The [id number] ([id number]) Olympus coloscope was  ?                          introduced through the anus and advanced to the the  ?                          rectum. After obtaining informed consent, the scope  ?                          was passed under direct vision. The flexible  ?                          sigmoidoscopy was extremely difficult due to  ?                          inadequate bowel prep. The patient tolerated the  ?                          procedure well. The quality of the bowel  ?                          preparation was poor. ?Scope In: 11:59:45 AM ?Scope Out: 12:01:03 PM ?Total Procedure Duration: 0 hours 1 minute 18 seconds  ?Findings: ?     Solid stool in rectum-procedure aborted. ?Impression:               - Preparation of the colon was extremely  ?                          poor-procedure aborted. ?                          - No specimens collected. ?Moderate Sedation: ?     MAC used.Marland Kitchen ?Recommendation:           - Resume regular diet today. ?                          -  As per my dicussion with patient's healthcare  ?                          POA, [name], no further intervantions  ?                          planned; patient is unable to prep. ?Procedure Code(s):        --- Professional --- ?                          85631, 52, Sigmoidoscopy, flexible; diagnostic,  ?                          including collection of specimen(s) by brushing or  ?                          washing, when performed (separate procedure) ?Diagnosis Code(s):        --- Professional --- ?                          D50.9, Iron deficiency anemia, unspecified ?                          K92.1, Melena (includes Hematochezia) ?CPT copyright 2019 American Medical Association. All rights reserved. ?The codes documented in this report are preliminary and upon coder review may  ?be revised to meet current  compliance requirements. ?Juanita Craver, MD ?Juanita Craver, MD ?04/08/2021 12:32:33 PM ?This report has been signed electronically. ?Number of Addenda: 0 ?

## 2021-04-08 NOTE — Op Note (Addendum)
[facility] ?Patient Name: Jermaine Nixon ?Procedure Date : 04/08/2021 ?MRN: [id number] ?Attending MD: Juanita Craver , MD ?Date of Birth: [birth year] ?CSN: [id number] ?Age: 76 ?Admit Type: Inpatient ?Procedure:                EGD with removal of foreign body. ?Indications:              Unexplained iron deficiency anemia, Heme positive  ?                          stool, Dyspahgai, ?Providers:                Juanita Craver, MD, Carmie End, RN, Alphonzo Grieve  ?                          Leighton Roach, Technician, Keturah Barre, CRNA, Jeneen Rinks  ?                          Tobias Alexander, MD ?Referring MD:             [facility] ?Medicines:                Monitored Anesthesia Care ?Complications:            No immediate complications. ?Estimated Blood Loss:     Estimated blood loss: none. ?Procedure:                Pre-Anesthesia Assessment: - Prior to the  ?                          procedure, a history and physical was performed,  ?                          and patient medications and allergies were  ?                          reviewed. The patient's tolerance of previous  ?                          anesthesia was also reviewed. The risks and  ?                          benefits of the procedure and the sedation options  ?                          and risks were discussed with the patient. All  ?                          questions were answered, and informed consent was  ?                          obtained. Prior Anticoagulants: The patient has  ?                          taken no previous anticoagulant or antiplatelet  ?  agents. ASA Grade Assessment: III - A patient with  ?                          severe systemic disease. After reviewing the risks  ?                          and benefits, the patient was deemed in  ?                          satisfactory condition to undergo the procedure.  ?                          After obtaining informed consent, the endoscope was  ?                          passed under direct  vision. Throughout the  ?                          procedure, the patient's blood pressure, pulse, and  ?                          oxygen saturations were monitored continuously. The  ?                          [id number]-[id number] ([id number]) Olympus endoscope was introduced  ?                          through the mouth, and advanced to the second part  ?                          of duodenum. The EGD was performed with moderate  ?                          difficulty due to presence of foreign body. The  ?                          patient tolerated the procedure well. ?Scope In: ?Scope Out: ?Findings: ?     The examined esophagus and GEJ appeared widely patent and normal. ?     A large foreign body was noted in the gastric fundus; this was removed  ?     with a snare as a Talon forceps was not able to grasp it; this was done  ?     after intubating the patient-foreign body was a large piece of plastic  ?     rolled up in a ball. ?     The examined duodenum was normal. ?     No fresh or old heme noted on exam. ?Impression:               - Normal appearing, widely patent esophagus and GEJ. ?                          - Large foreign body found in the stomach; removal  ?  was successful with a snare-large piece of plastic  ?                          rolled up in a ball. ?                          - Some bleeding from the esophagus while pulling  ?                          the foreign body out. ?                          - Normal examined duodenum. ?                          - No source of blood loss identified. ?Moderate Sedation: ?     MAC used.Marland Kitchen ?Recommendation:           - Clear liquid diet today. ?                          - Continue present medications. ?                          - No further interventations as per my discussion  ?                          with the patient's POA ?Procedure Code(s):        --- Professional --- ?                          [phone], Esophagogastroduodenoscopy, flexible,  ?                           transoral; with removal of foreign body(s) ?Diagnosis Code(s):        --- Professional --- ?                          D50.9, Iron deficiency anemia, unspecified ?                          K92.1, Melena (includes Hematochezia) ?                          T18.2XXA, Foreign body in stomach, initial encounter ?                          R13.10, Dysphagia, unspecified ?CPT copyright 2019 American Medical Association. All rights reserved. ?The codes documented in this report are preliminary and upon coder review may  ?be revised to meet current compliance requirements. ?Juanita Craver, MD ?Juanita Craver, MD ?04/08/2021 12:24:23 PM ?This report has been signed electronically. ?Number of Addenda: 0 ?

## 2021-04-08 NOTE — Anesthesia Procedure Notes (Signed)
Procedure Name: Intubation ?Date/Time: 04/08/2021 11:33 AM ?Performed by: Rande Brunt, CRNA ?Pre-anesthesia Checklist: Patient identified, Emergency Drugs available, Suction available and Patient being monitored ?Patient Re-evaluated:Patient Re-evaluated prior to induction ?Oxygen Delivery Method: Circle System Utilized ?Preoxygenation: Pre-oxygenation with 100% oxygen ?Induction Type: IV induction, Rapid sequence and Cricoid Pressure applied ?Ventilation: Mask ventilation without difficulty ?Laryngoscope Size: Mac and 4 ?Grade View: Grade I ?Tube type: Oral ?Tube size: 7.5 mm ?Number of attempts: 1 ?Airway Equipment and Method: Stylet and Oral airway ?Placement Confirmation: ETT inserted through vocal cords under direct vision, positive ETCO2 and breath sounds checked- equal and bilateral ?Secured at: 22 cm ?Tube secured with: Tape ?Dental Injury: Teeth and Oropharynx as per pre-operative assessment  ? ? ? ? ?

## 2021-04-08 NOTE — Anesthesia Procedure Notes (Addendum)
Procedure Name: [place] ?Date/Time: 04/08/2021 11:18 AM ?Performed by: Rande Brunt, CRNA ?Pre-anesthesia Checklist: Patient identified, Emergency Drugs available, Suction available and Patient being monitored ?Patient Re-evaluated:Patient Re-evaluated prior to induction ?Oxygen Delivery Method: Nasal cannula ?Preoxygenation: Pre-oxygenation with 100% oxygen ?Induction Type: IV induction ?Ventilation: Oral airway inserted - appropriate to patient size ?Airway Equipment and Method: Bite block ?Placement Confirmation: CO2 detector and positive ETCO2 ?Dental Injury: Teeth and Oropharynx as per pre-operative assessment  ? ? ? ? ?

## 2021-04-09 LAB — CBC
HCT: 31.9 % — ABNORMAL LOW (ref 39.0–52.0)
Hemoglobin: 10.5 g/dL — ABNORMAL LOW (ref 13.0–17.0)
MCH: 30 pg (ref 26.0–34.0)
MCHC: 32.9 g/dL (ref 30.0–36.0)
MCV: 91.1 fL (ref 80.0–100.0)
Platelets: 210 10*3/uL (ref 150–400)
RBC: 3.5 MIL/uL — ABNORMAL LOW (ref 4.22–5.81)
RDW: 15.7 % — ABNORMAL HIGH (ref 11.5–15.5)
WBC: 7.4 10*3/uL (ref 4.0–10.5)
nRBC: 0 % (ref 0.0–0.2)

## 2021-04-09 LAB — BASIC METABOLIC PANEL
Anion gap: 8 (ref 5–15)
BUN: 9 mg/dL (ref 8–23)
CO2: 24 mmol/L (ref 22–32)
Calcium: 8.9 mg/dL (ref 8.9–10.3)
Chloride: 104 mmol/L (ref 98–111)
Creatinine, Ser: 0.83 mg/dL (ref 0.61–1.24)
GFR, Estimated: >60 mL/min (ref >60)
Glucose, Bld: 160 mg/dL — ABNORMAL HIGH (ref 70–99)
Potassium: 4.7 mmol/L (ref 3.5–5.1)
Sodium: 136 mmol/L (ref 135–145)

## 2021-04-09 LAB — GLUCOSE, CAPILLARY
Glucose-Capillary: 144 mg/dL — ABNORMAL HIGH (ref 70–99)
Glucose-Capillary: 201 mg/dL — ABNORMAL HIGH (ref 70–99)
Glucose-Capillary: 274 mg/dL — ABNORMAL HIGH (ref 70–99)
Glucose-Capillary: 293 mg/dL — ABNORMAL HIGH (ref 70–99)

## 2021-04-09 MED ORDER — DOXYCYCLINE HYCLATE 100 MG PO TABS
100.0000 mg | ORAL_TABLET | Freq: Two times a day (BID) | ORAL | Status: DC
Start: 2021-04-09 — End: 2021-04-13
  Administered 2021-04-09 – 2021-04-13 (×8): 100 mg via ORAL
  Filled 2021-04-09 (×8): qty 1

## 2021-04-09 MED ORDER — MINERAL OIL RE ENEM
1.0000 | ENEMA | Freq: Once | RECTAL | Status: AC
  Administered 2021-04-09: 1 via RECTAL
  Filled 2021-04-09: qty 1

## 2021-04-09 MED ORDER — POLYETHYLENE GLYCOL 3350 17 G PO PACK
17.0000 g | PACK | Freq: Two times a day (BID) | ORAL | Status: DC
  Administered 2021-04-09 – 2021-04-11 (×4): 17 g via ORAL
  Filled 2021-04-09 (×4): qty 1

## 2021-04-09 MED ORDER — MINERAL OIL RE ENEM
1.0000 | ENEMA | Freq: Two times a day (BID) | RECTAL | Status: AC
  Administered 2021-04-09 – 2021-04-10 (×2): 1 via RECTAL
  Filled 2021-04-09 (×2): qty 1

## 2021-04-09 MED ORDER — DOCUSATE SODIUM 100 MG PO CAPS: 100.0000 mg | ORAL_CAPSULE | Freq: Two times a day (BID) | ORAL | Status: DC | PRN

## 2021-04-09 MED ORDER — DOXYCYCLINE HYCLATE 100 MG PO TABS: 100.0000 mg | ORAL_TABLET | Freq: Two times a day (BID) | ORAL | 0 refills | Status: DC

## 2021-04-09 NOTE — Progress Notes (Addendum)
Received referral for [facility] PT/OT. Met with pt and sister [name]). Pt lives with his sister. Discussed [facility] referral. She reports that he used [facility] 3 months ago and she was happy with their services. Jermaine Nixon with [facility] and left a VM regarding referral and informed her that pt is going home today. Also contacted Jermaine Nixon with [facility] for referral. Jermaine Nixon is in [place] for some Jermaine Nixon Medicare plans. Jermaine Nixon reports that she has to check and she will f/u. Contacted Jermaine Nixon and she reports that she can't take pt's referral. Awaiting for Amy to return call. [name] made aware of above. ?

## 2021-04-09 NOTE — Discharge Instructions (Addendum)
Follow with Primary MD [name], FNP in 7 days  ? ?Get CBC, CMP, 2 view Chest X ray -  checked next visit within 1 week by  SNF MD  ? ?Activity: As tolerated with Full fall precautions use walker/cane & assistance as needed ? ?Disposition SNF ? ?Diet: Heart Healthy Low Carb with feeding assistance and aspiration precautions.  CBGs q. [facility] S. ?  ? ?Special Instructions: If you have smoked or chewed Tobacco  in the last 2 yrs please stop smoking, stop any regular Alcohol  and or any Recreational drug use. ? ?On your next visit with your primary care physician please Get Medicines reviewed and adjusted. ? ?Please request your Prim.MD to go over all Hospital Tests and Procedure/Radiological results at the follow up, please get all Hospital records sent to your Prim MD by signing hospital release before you go home. ? ?If you experience worsening of your admission symptoms, develop shortness of breath, life threatening emergency, suicidal or homicidal thoughts you must seek medical attention immediately by calling 911 or calling your MD immediately  if symptoms less severe. ? ?You Must read complete instructions/literature along with all the possible adverse reactions/side effects for all the Medicines you take and that have been prescribed to you. Take any new Medicines after you have completely understood and accpet all the possible adverse reactions/side effects.  ? ?  ?

## 2021-04-09 NOTE — Evaluation (Signed)
Occupational Therapy Evaluation ?Patient Details ?Name: Jermaine Nixon ?MRN: [id number] ?DOB: 01/12/1945 ?Today's Date: 04/09/2021 ? ? ?History of Present Illness 76 y/o male presented to ED on 04/06/21 for weakness, hypotension, and shaking. Admitted for AKI with lactic acidosis and dehydration. CXR negative. S/p EGD with foreign body removal on 4/7. PMH: T2DM, mental disability, BPH, HTN  ? ?Clinical Impression ?  ?PLOF and home information obatined via chart review, however pt reports sister assists him with ADLs. Pt oriented to self and place during session, able to respond mostly to yes/no questions. Pt agreeable to UE/LE ROM and strength testing however declining further mobility. Pt able to self feed for short portions of meal, however overall mod-max A for ADLs including eating and grooming task at bed level during session. Pt presenting with impairments listed below, will follow acutely. Recommend HHOT at d/c. ?   ? ?Recommendations for follow up therapy are one component of a multi-disciplinary discharge planning process, led by the attending physician.  Recommendations may be updated based on patient status, additional functional criteria and insurance authorization.  ? ?Follow Up Recommendations ? Home health OT  ?  ?Assistance Recommended at Discharge Frequent or constant Supervision/Assistance  ?Patient can return home with the following A lot of help with walking and/or transfers;A lot of help with bathing/dressing/bathroom;Assistance with feeding;Assistance with cooking/housework;Direct supervision/assist for medications management;Assist for transportation;Help with stairs or ramp for entrance;Direct supervision/assist for financial management ? ?  ?Functional Status Assessment ? Patient has had a recent decline in their functional status and demonstrates the ability to make significant improvements in function in a reasonable and predictable amount of time.  ?Equipment Recommendations ? BSC/3in1  ?   ?Recommendations for Other Services   ? ? ?  ?Precautions / Restrictions Precautions ?Precautions: Fall ?Precaution Comments: cognitive disability ?Restrictions ?Weight Bearing Restrictions: No  ? ?  ? ?Mobility Bed Mobility ?Overal bed mobility: Needs Assistance ?  ?  ?  ?  ?  ?  ?General bed mobility comments: pt initially agreeable to mobility, however then refusing, able to move BUE/BLE in bed when asked ?  ? ?Transfers ?  ?  ?  ?  ?  ?  ?  ?  ?  ?General transfer comment: unable to attempt, pt declining ?  ? ?  ?Balance   ?  ?  ?  ?  ?  ?  ?  ?  ?  ?  ?  ?  ?  ?  ?  ?  ?  ?  ?   ? ?ADL either performed or assessed with clinical judgement  ? ?ADL Overall ADL's : Needs assistance/impaired ?Eating/Feeding: Moderate assistance;Sitting ?Eating/Feeding Details (indicate cue type and reason): self feeds at times ?Grooming: Maximal assistance;Bed level;Wash/dry face ?  ?Upper Body Bathing: Maximal assistance;Bed level ?  ?Lower Body Bathing: Maximal assistance;Bed level ?  ?Upper Body Dressing : Moderate assistance;Bed level ?  ?Lower Body Dressing: Maximal assistance;Bed level ?  ?Toilet Transfer: Maximal assistance;+2 for physical assistance;BSC/3in1;Cueing for sequencing ?  ?Toileting- Clothing Manipulation and Hygiene: Maximal assistance;Bed level ?  ?  ?  ?  ?   ? ? ? ?Vision   ?Vision Assessment?: No apparent visual deficits  ?   ?Perception   ?  ?Praxis   ?  ? ?Pertinent Vitals/Pain Pain Assessment ?Pain Assessment: No/denies pain  ? ? ? ?Hand Dominance   ?  ?Extremity/Trunk Assessment Upper Extremity Assessment ?Upper Extremity Assessment: Generalized weakness ?  ?Lower Extremity Assessment ?Lower Extremity Assessment:  Defer to PT evaluation ?  ?Cervical / Trunk Assessment ?Cervical / Trunk Assessment: Kyphotic ?  ?Communication Communication ?Communication: Expressive difficulties ?  ?Cognition Arousal/Alertness: Awake/alert ?Behavior During Therapy: [facility] for tasks assessed/performed ?Overall Cognitive Status:  History of cognitive impairments - at baseline ?  ?  ?  ?  ?  ?  ?  ?  ?  ?  ?  ?  ?  ?  ?  ?  ?General Comments: no family present during eval ?  ?  ?General Comments  VSS on RA ? ?  ?Exercises   ?  ?Shoulder Instructions    ? ? ?Home Living Family/patient expects to be discharged to:: Private residence ?Living Arrangements: Other (Comment) ?Available Help at Discharge: Family ?Type of Home: House ?Home Access: Stairs to enter ?Entrance Stairs-Number of Steps: 1 ?Entrance Stairs-Rails: None ?Home Layout: Two level;Able to live on main level with bedroom/bathroom;Full bath on main level ?Alternate Level Stairs-Number of Steps: pt stays on main level ?  ?Bathroom Shower/Tub: Walk-in shower ?  ?Bathroom Toilet: Handicapped height ?Bathroom Accessibility: Yes ?  ?Home Equipment: Shower seat;Grab bars - tub/shower;Rolling Walker (2 wheels) ?  ?Additional Comments: has a caregiver 2 hours every day, helps with bathing, dressing. pt's sister [name] does a lot for him during the day. ?  ? ?  ?Prior Functioning/Environment Prior Level of Function : Independent/Modified Independent ?  ?  ?  ?  ?  ?  ?Mobility Comments: per pt's sister he is typically independent and does not use RW for gait but she watches him closely. pt requires ?ADLs Comments: pt wears depends and requires assist for dressing, bathing, and toileting - typically able to assist. ?  ? ?  ?  ?OT Problem List:   ?  ?   ?OT Treatment/Interventions: Self-care/ADL training;Therapeutic exercise;Energy conservation;DME and/or AE instruction;Therapeutic activities;Patient/family education;Balance training  ?  ?OT Goals(Current goals can be found in the care plan section) Acute Rehab OT Goals ?Patient Stated Goal: none stated ?OT Goal Formulation: With patient ?Time For Goal Achievement: 04/23/21 ?Potential to Achieve Goals: Fair ?ADL Goals ?Pt Will Perform Upper Body Dressing: with mod assist;sitting;standing;bed level ?Pt Will Perform Lower Body Dressing: with mod  assist;sitting/lateral leans;sit to/from stand ?Pt Will Transfer to Toilet: with mod assist;ambulating;bedside commode;stand pivot transfer ?Pt Will Perform Tub/Shower Transfer: with mod assist;with caregiver independent in assisting;shower seat;ambulating;rolling walker;Shower transfer;Tub transfer  ?OT Frequency: Min 2X/week ?  ? ?Co-evaluation PT/OT/SLP Co-Evaluation/Treatment: Yes ?Reason for Co-Treatment: Complexity of the patient's impairments (multi-system involvement);For patient/therapist safety;To address functional/ADL transfers ?  ?OT goals addressed during session: ADL's and self-care ?  ? ?  ?AM-PAC OT "6 Clicks" Daily Activity     ?Outcome Measure Help from another person eating meals?: A Lot ?Help from another person taking care of personal grooming?: A Lot ?Help from another person toileting, which includes using toliet, bedpan, or urinal?: A Lot ?Help from another person bathing (including washing, rinsing, drying)?: A Lot ?Help from another person to put on and taking off regular upper body clothing?: A Lot ?Help from another person to put on and taking off regular lower body clothing?: A Lot ?6 Click Score: 12 ?  ?End of Session Nurse Communication: Mobility status ? ?Activity Tolerance: Treatment limited secondary to agitation ?Patient left: in bed;with call bell/phone within reach;with bed alarm set ? ?OT Visit Diagnosis: Unsteadiness on feet (R26.81);Muscle weakness (generalized) (M62.81);Cognitive communication deficit (R41.841)  ?              ?  Time: 2778-2423 ?OT Time Calculation (min): 23 min ?Charges:  OT General Charges ?$OT Visit: 1 Visit ?OT Evaluation ?$OT Eval Moderate Complexity: 1 Mod ? ?Alfonzo Beers, OTD, OTR/L ?Acute Rehab ?[phone] ? ? ?Mayer Masker ?04/09/2021, 12:27 PM ?

## 2021-04-09 NOTE — Progress Notes (Signed)
?PROGRESS NOTE ? ? ? ?Jermaine Nixon  [id number] DOB: [birth year] DOA: 04/06/2021 ?PCP: Saintclair Halsted, FNP  ? ?  ?Brief Narrative:   ?Patient is a 76 year old male with diabetes mellitus type 2, mental disability, BPH, hypertension presented to ED with acute onset of generalized weakness, hypotension and shaking while he was eating.  Patient is fairly poor historian due to mental disability.  He was noted to have mild hypoxia requiring O2 at 3 L and subsequently sats were 95 to 97% on 2 L.  No chest pain or palpitations, fevers or chills. ?Labs showed hyponatremia sodium 131, hyperkalemia 5.6.  Glucose 202, creatinine 1.55.  Creatinine was 0.8 on 02/11/2021.  Lactic acid 4.9 ?WBCs 10.7, troponin 46-47.  EKG had shown inferior lateral T waves.  Chest x-ray showed no acute cardiopulmonary disease. ?Patient was placed on IV fluids, started on broad-spectrum antibiotics. ? ?New events last 24 hours / Subjective: ?IV abx stopped, transitioned to PO doxycycline for 5 d given concern for PNA. Most of issues resolved. Having abd distension and had not had a BM. EGD neg other than FB, GI signed off. Sister notes he was walking PTA but is not. PT/OT rec'd HH. ? ?Assessment & Plan: ?  ?Principal Problem: ?  Lactic acidosis ?            Resolved ?            Given imaging, will give 5 d of doxycycline 100 mg bid ?  ?Active Problems: ?  Deconditioning ? Will consult PT again ?  ?  Constipation ? Encourage PO hydration ? Miralax BID ? Enema tonight and tomorrow ? Colace bid prn ? ?  Dehydration ?            Encourage hydration ?  ?  AKI (acute kidney injury) ([place]) ?            Resolved ?  ?  Hyperkalemia ?            Resolved ?  ?  Hypoxemia ?            Resolved ?  ?  Type 2 diabetes mellitus without complications ([place]) ?            Cont sliding scale ?  ?  Essential hypertension ?            Lotensin 20 mg daily ?            Hydrochlorothiazide 25 mg daily ?  ?  Dyslipidemia ?            Lipitor 40 mg daily ?  ?  Anemia ?             Stable  ?  ?  Heme positive stool ?            EGD unremarkable other than foreign body ?            Unable to complete colonoscopy due to inadequate prep ?            Hemoglobin stable, will manage outpatient ? ? ?DVT prophylaxis: Lovenox ?Code Status: full  ?Family Communication: Sister at bedside ?Coming From: home ?Disposition Plan: hopefully home ?Barriers to Discharge: lack of BM, family reluctance ? ?Consultants:  ?GI ? ?Procedures:  ?EGD 04/08/21; failed colonoscopy due to stool burden ? ?Antimicrobials:  ?Anti-infectives (From admission, onward)  ? ? Start     Dose/Rate Route Frequency Ordered Stop  ? 04/09/21 0000  vancomycin (VANCOREADY)  IVPB 1250 mg/250 mL  Status:  Discontinued       ? 1,250 mg ?166.7 mL/hr over 90 Minutes Intravenous Every 48 hours 04/06/21 2306 04/09/21 1017  ? 04/09/21 0000  doxycycline (VIBRA-TABS) 100 MG tablet       ? 100 mg Oral 2 times daily 04/09/21 1507 04/14/21 2359  ? 04/07/21 1200  ceFEPIme (MAXIPIME) 2 g in sodium chloride 0.9 % 100 mL IVPB  Status:  Discontinued       ? 2 g ?200 mL/hr over 30 Minutes Intravenous Every 12 hours 04/06/21 2306 04/09/21 1017  ? 04/06/21 2300  ceFEPIme (MAXIPIME) 2 g in sodium chloride 0.9 % 100 mL IVPB       ? 2 g ?200 mL/hr over 30 Minutes Intravenous  Once 04/06/21 2249 04/07/21 0012  ? 04/06/21 2300  metroNIDAZOLE (FLAGYL) IVPB 500 mg       ? 500 mg ?100 mL/hr over 60 Minutes Intravenous  Once 04/06/21 2249 04/07/21 0135  ? 04/06/21 2300  vancomycin (VANCOCIN) IVPB 1000 mg/200 mL premix       ? 1,000 mg ?200 mL/hr over 60 Minutes Intravenous  Once 04/06/21 2249 04/07/21 0240  ? ?  ? ? ? ?Objective: ?Vitals:  ? 04/08/21 2046 04/09/21 0453 04/09/21 0847 04/09/21 1618  ?BP: 135/71 (!) 140/91 128/69 131/78  ?Pulse: 78 93 70 94  ?Resp: 18 17 15 17   ?Temp: 97.6 ?F (36.4 ?C) 97.6 ?F (36.4 ?C) 97.6 ?F (36.4 ?C)   ?TempSrc:   Oral   ?SpO2: 98% 100% 99% 98%  ?Weight:      ?Height:      ? ? ?Intake/Output Summary (Last 24 hours) at 04/09/2021  1734 ?Last data filed at 04/09/2021 1300 ?Gross per 24 hour  ?Intake 1570 ml  ?Output 3350 ml  ?Net -1780 ml  ? ?Filed Weights  ? 04/06/21 2127 04/07/21 0200  ?Weight: 59.3 kg 60 kg  ? ? ?Examination:  ?General exam: Appears calm and comfortable  ?Respiratory system: Clear to auscultation. Respiratory effort normal. No respiratory distress. No conversational dyspnea.  ?Cardiovascular system: S1 & S2 heard, RRR.  No pedal edema. ?Gastrointestinal system: Abdomen is mildly distended, soft and nontender. Normal bowel sounds heard. ?Central nervous system: Awake, speech not intelligible ?Extremities: Symmetric in appearance  ?Skin: No rashes, lesions or ulcers on exposed skin  ?Psychiatry: Judgement and insight appear limited. ? ?Data Reviewed: I have personally reviewed following labs and imaging studies ? ?CBC: ?Recent Labs  ?Lab 04/06/21 ?2221 04/07/21 ?0437 04/08/21 ?0243 04/09/21 ?0258  ?WBC 10.7* 7.8 6.2 7.4  ?NEUTROABS 8.1*  --   --   --   ?HGB 10.0* 9.8* 9.3* 10.5*  ?HCT 29.0* 29.6* 27.4* 31.9*  ?MCV 89.5 90.0 89.3 91.1  ?PLT 234 191 218 210  ? ?Basic Metabolic Panel: ?Recent Labs  ?Lab 04/06/21 ?2221 04/07/21 ?0437 04/08/21 ?0243 04/09/21 ?0258  ?NA 131* 133* 141 136  ?K 5.6* 4.5 4.0 4.7  ?CL 92* 100 107 104  ?CO2 26 24 27 24   ?GLUCOSE 202* 170* 136* 160*  ?BUN 58* 46* 20 9  ?CREATININE 1.55* 1.25* 0.85 0.83  ?CALCIUM 9.6 9.4 9.0 8.9  ? ?GFR: ?Estimated Creatinine Clearance: 62.2 mL/min (by C-G formula based on SCr of 0.83 mg/dL). ? ?Liver Function Tests: ?Recent Labs  ?Lab 04/06/21 ?2221  ?AST 26  ?ALT 16  ?ALKPHOS 53  ?BILITOT 0.5  ?PROT 7.0  ?ALBUMIN 3.5  ? ?Recent Labs  ?Lab 04/06/21 ?2221  ?LIPASE 33  ? ?HbA1C: ?  Recent Labs  ?  04/07/21 ?0437  ?HGBA1C 9.9*  ? ?CBG: ?Recent Labs  ?Lab 04/08/21 ?1629 04/08/21 ?2047 04/09/21 ?0731 04/09/21 ?1113 04/09/21 ?1620  ?GLUCAP 244* 202* 144* 201* 274*  ? ?Anemia Panel: ?Recent Labs  ?  04/07/21 ?0710  ?VITAMINB12 190  ?FOLATE 12.0  ?FERRITIN 50  ?TIBC 297  ?IRON 47   ?RETICCTPCT 1.5  ? ?Sepsis Labs: ?Recent Labs  ?Lab 04/06/21 ?2221 04/07/21 ?[id number] 04/07/21 ?[id number] 04/07/21 ?0710  ?PROCALCITON  --   --   --  <0.10  ?LATICACIDVEN 4.9* 3.9* 3.1* 1.1  ? ? ?Recent Results (from the past 240 hour(s))  ?Culture, blood (routine x 2)     Status: None (Preliminary result)  ? Collection Time: 04/06/21 10:02 PM  ? Specimen: BLOOD LEFT FOREARM  ?Result Value Ref Range Status  ? Specimen Description   Final  ?  BLOOD LEFT FOREARM ?Performed at [facility], [address] [place], [place] [address] ?  ? Special Requests   Final  ?  BOTTLES DRAWN AEROBIC AND ANAEROBIC Blood Culture adequate volume ?Performed at [facility], [address], [place], [place] [address] ?  ? Culture   Final  ?  NO GROWTH 2 DAYS ?Performed at [facility], [place] [address] [place], [place] [address] ?  ? Report Status PENDING  Incomplete  ?Culture, blood (routine x 2)     Status: None (Preliminary result)  ? Collection Time: 04/06/21 10:18 PM  ? Specimen: BLOOD LEFT FOREARM  ?Result Value Ref Range Status  ? Specimen Description   Final  ?  BLOOD LEFT FOREARM ?Performed at [facility], [address], [place], [place] [address] ?  ? Special Requests   Final  ?  BOTTLES DRAWN AEROBIC ONLY Blood Culture results may not be optimal due to an inadequate volume of blood received in culture bottles ?Performed at [facility], [address] [place], [place] [address] ?  ? Culture   Final  ?  NO GROWTH 2 DAYS ?Performed at [facility], [place] [address], [place], [place] [address] ?  ? Report Status PENDING  Incomplete  ?Resp Panel by RT-PCR (Flu A&B, Covid) Nasopharyngeal Swab     Status: None  ? Collection Time: 04/06/21 11:20 PM  ? Specimen: Nasopharyngeal Swab; Nasopharyngeal(NP) swabs in vial transport medium  ?Result Value Ref Range Status  ? SARS Coronavirus 2 by RT PCR NEGATIVE NEGATIVE Final  ?  Comment: (NOTE) ?SARS-CoV-2 target nucleic acids are NOT  DETECTED. ? ?The SARS-CoV-2 RNA is generally detectable in upper respiratory ?specimens during the acute phase of infection. The lowest ?concentration of SARS-CoV-2 viral copies this assay can detect is ?138 copies/mL

## 2021-04-09 NOTE — Progress Notes (Signed)
Discharge instructions given and pt was being dressed, when pt's sister reported that she could not take pt home since pt had not had a bowel and that pt's abdomen was distended. Sister also reported that pt could not walk and that prior to admission, pt was able to walk with assistance. Dr. Nani Ravens made aware. Orde given to hold discharge. Pt's sister, at bedside, made aware.  ?

## 2021-04-09 NOTE — Discharge Summary (Deleted)
Physician Discharge Summary  ?Jermaine Nixon VPX:[id number] DOB: [birth year] DOA: 04/06/2021 ? ?PCP: Saintclair Halsted, FNP ? ?Admit date: 04/06/2021 ?Discharge date: 04/09/2021 ? ?Admitted From: Home ?Disposition:  Home ? ?Recommendations for Outpatient Follow-up:  ?Follow up with PCP in 1 week ?Please obtain BMP/CBC in 1 week to ensure stability of Cr and Hb respectively. If Hb remains stable, may not be necessary to have colonoscopy despite FOBT+. ?Please follow up on the following pending results: Blood cultures drawn on 04/06/21 pending, NGTD ?Patient's sister inquired about dementia evaluation. ? ?Home Health: PT/OT  ?Equipment/Devices: None  ? ?Discharge Condition: Good ?CODE STATUS: Full  ?Diet recommendation: Low carb ? ?Brief/Interim Summary: ?From H&P: from Dr. Tacey Heap: "Jermaine Nixon is a 76 y.o. African-American male with medical history significant for type 2 diabetes mellitus, mental disability, BPH and hypertension, who presented to the emergency room with acute onset of generalized weakness with associated hypotension and shaking while he was eating.  The patient denied any cough or dyspnea or wheezing however he is a fairly poor historian due to mental disability.  He was having mild hypoxia requiring O2 at 3 L/min by nasal cannula and subsequent pulse symmetry of 92% later on it was up to 95-97% on 2 L.  He did not have any chest pain or palpitations.  No fever or chills.  No reported dysuria, oliguria or hematuria, urgency or frequency or flank pain. ?  ?ED Course: Upon presentation to the emergency room, vital signs were within initially and later was 91% on room air as mentioned above.  Later Pulsoxymeter was 97% on 2 L of O2 by nasal cannula.  Labs revealed hyponatremia 131 and hypochloremia of 92 with hyperkalemia of 5.6.  Glucose was 202 and BUN 58 with creatinine 1.55 compared to 22/0.89 on 02/11/2021.  Lactic acid was 4.9 and later 3.9.  High-sensitivity troponin I was 46 and later 47.  CBC showed WBC of  10.7 with neutrophilia and anemia slightly worse than previous levels. ?EKG as reviewed by me : EKG showed normal sinus rhythm with a rate of 91 with right atrial enlargement and left bundle branch block with T wave inversion inferolaterally. ?Imaging: Portable chest x-ray showed no acute cardiopulmonary disease." ? ?Interim: Hemoglobin remained stable, lab normalities improved with vancomycin and cefepime. ? ?Subjective on day of discharge: Patient has no complaints.  I spoke with his sister reports he looks more alert back to his baseline.  Bowel movements have been an issue.  Enema given today. ? ?Discharge Diagnoses:  ?Principal Problem: ?  Lactic acidosis ? Resolved ? Given imaging, will give 5 d of doxycycline 100 mg bid ? ?Active Problems: ?  Dehydration ? Encourage hydration ? ?  AKI (acute kidney injury) ([place]) ? Resolved ? ?  Hyperkalemia ? Resolved ? ?  Hypoxemia ? Resolved ? ?  Type 2 diabetes mellitus without complications ([place]) ? Return to home regimen-1, Jardiance, Actos ? ?  Essential hypertension ? Lotensin 20 mg daily ? Hydrochlorothiazide 25 mg daily ? ?  Dyslipidemia ? Lipitor 40 mg daily ? ?  Anemia ? Stable on discharge ? ?  Heme positive stool ? EGD unremarkable other than foreign body ? Unable to complete colonoscopy due to inadequate prep ? Hemoglobin stable, will manage outpatient ? ?Discharge Instructions ? ?Discharge Instructions   ? ? Diet - low sodium heart healthy   Complete by: As directed ?  ? Increase activity slowly   Complete by: As directed ?  ? ?  ? ?  Allergies as of 04/09/2021   ?No Known Allergies ?  ? ?  ?Medication List  ?  ? ?TAKE these medications   ? ?aspirin EC 81 MG tablet ?Take 81 mg by mouth daily. ?  ?Assure Comfort Lancets 28G Misc ?Frequency:   Dosage:0     Instructions:  Note:check glucose daily ?  ?atorvastatin 40 MG tablet ?Commonly known as: LIPITOR ?Take 40 mg by mouth daily. ?  ?benazepril 20 MG tablet ?Commonly known as: LOTENSIN ?Take 20 mg by mouth daily. ?   ?Fifty50 Glucose Meter 2.0 w/Device Kit ?Frequency:   Dosage:0     Instructions:  Note:check blood sugar daily ?  ?hydrochlorothiazide 25 MG tablet ?Commonly known as: HYDRODIURIL ?Take 25 mg by mouth daily. ?  ?Jardiance 10 MG Tabs tablet ?Generic drug: empagliflozin ?Take 1 tablet (10 mg total) by mouth daily. Hold for now, please follow up with your pcp to discuss resumption  this medication ?What changed: additional instructions ?  ?metFORMIN 500 MG tablet ?Commonly known as: GLUCOPHAGE ?Take 500 mg by mouth 2 (two) times daily with a meal. ?  ?pioglitazone 45 MG tablet ?Commonly known as: ACTOS ?Take 45 mg by mouth daily. ?  ?Prodigy No Coding Blood Gluc test strip ?Generic drug: glucose blood ?Frequency:Daily   Dosage:1     Instructions:  Note:TEST ONCE DAILY. ?  ?tamsulosin 0.4 MG Caps capsule ?Commonly known as: FLOMAX ?Take 0.4 mg by mouth daily after breakfast. ?  ? ?  ? ? ?No Known Allergies ? ?Consultations: ?Gastroenterology ? ? ?Procedures/Studies: ?CT Head Wo Contrast ? ?Result Date: 04/06/2021 ?CLINICAL DATA:  Mental status change, unknown cause EXAM: CT HEAD WITHOUT CONTRAST TECHNIQUE: Contiguous axial images were obtained from the base of the skull through the vertex without intravenous contrast. RADIATION DOSE REDUCTION: This exam was performed according to the departmental dose-optimization program which includes automated exposure control, adjustment of the mA and/or kV according to patient size and/or use of iterative reconstruction technique. 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 No acute intracranial abnormality. Specifically, no hemorrhage, hydrocephalus, mass lesion, acute infarction, or significant intracranial injury. Vascular: No hyperdense vessel or unexpected calcification. Skull: No acute calvarial abnormality. Sinuses/Orbits: No acute findings  Other: None IMPRESSION: Atrophy, chronic microvascular disease. No acute intracranial abnormality. 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 Electronically Signed   By: Rolm Baptise M.D.   On: 04/06/2021 23:56  ? ?CT Angio Chest Pulmonary Embolism (PE) W or WO Contrast ? ?Result Date: 04/07/2021 ?CLINICAL DATA:  Positive D-dimer EXAM: CT ANGIOGRAPHY CHEST WITH CONTRAST TECHNIQUE: Multidetector CT imaging of the chest was performed using the standard protocol during bolus administration of intravenous contrast. Multiplanar CT image reconstructions and MIPs were obtained to evaluate the vascular anatomy. RADIATION DOSE REDUCTION: This exam was performed according to the departmental dose-optimization program which includes automated exposure control, adjustment of the mA and/or kV according to patient size and/or use of iterative reconstruction technique. CONTRAST:  171m OMNIPAQUE IOHEXOL 350 MG/ML SOLN COMPARISON:  None. FINDINGS: Cardiovascular: Coronary artery calcifications are seen. Heart is enlarged in size. Small pericardial effusion is present. Contrast density in the thoracic aorta is less than adequate to evaluate the lumen. There is ectasia of ascending thoracic aorta and left side of aortic arch. There are no intraluminal filling defects in the pulmonary artery branches. Mediastinum/Nodes: No significant lymphadenopathy seen. Lungs/Pleura: There are  patchy infiltrates in the posterior lower lung fields. Small bilateral pleural effusions are seen. There is no pneumothorax. Upper Abdomen: Unremarkable. Musculoskeletal: There is partial fusion of some of the lower thoracic vertebral bodies. There is encroachment of neural foramina by bony spurs in the lower cervical spine and lower thoracic spine. Review of the MIP images confirms the above findings. IMPRESSION: There is no evidence of pulmonary artery embolism. Coronary artery calcifications are seen. There are patchy infiltrates  in the posterior lower lung fields suggesting atelectasis/pneumonia. Small bilateral pleural effusions are seen. Other findings as described in the body of the report. Electronically Signed   By: Royston Cowper

## 2021-04-09 NOTE — Evaluation (Signed)
Physical Therapy Evaluation ?Patient Details ?Name: Jermaine Nixon ?MRN: [id number] ?DOB: [birth year] ?Today's Date: 04/09/2021 ? ?History of Present Illness ? 76 y/o male presented to ED on 04/06/21 for weakness, hypotension, and shaking. Admitted for AKI with lactic acidosis and dehydration. CXR negative. S/p EGD with foreign body removal on 4/7. PMH: T2DM, mental disability, BPH, HTN  ?Clinical Impression ? Patient admitted with the above findings. Patient initially agreeable to mobility upon arrival, however after strength and ROM testing and initiation of transfer to EOB, patient began to refuse and resist attempts to assist to EOB. Patient demonstrates LE weakness based on bed level evaluation. Patient refused all aspects of mobility despite max encouragement. He would benefit from family being present to assist in encouragement. Patient will benefit from skilled PT services during acute stay to address listed deficits. Recommend HHPT at discharge as patient was recently admitted at [facility] and went home with sister and HHPT.    ?   ? ?Recommendations for follow up therapy are one component of a multi-disciplinary discharge planning process, led by the attending physician.  Recommendations may be updated based on patient status, additional functional criteria and insurance authorization. ? ?Follow Up Recommendations Home health PT ? ?  ?Assistance Recommended at Discharge Frequent or constant Supervision/Assistance  ?Patient can return home with the following ? A lot of help with walking and/or transfers;A lot of help with bathing/dressing/bathroom;Assistance with cooking/housework;Assistance with feeding;Direct supervision/assist for medications management;Direct supervision/assist for financial management;Assist for transportation;Help with stairs or ramp for entrance ? ?  ?Equipment Recommendations None recommended by PT  ?Recommendations for Other Services ?    ?  ?Functional Status Assessment Patient has had a recent  decline in their functional status and demonstrates the ability to make significant improvements in function in a reasonable and predictable amount of time.  ? ?  ?Precautions / Restrictions Precautions ?Precautions: Fall ?Precaution Comments: cognitive disability ?Restrictions ?Weight Bearing Restrictions: No  ? ?  ? ?Mobility ? Bed Mobility ?  ?  ?  ?  ?  ?  ?  ?General bed mobility comments: patient agreeable to mobility upon arrival but with initiation of assisting patient towards EOB x 4 attempts, patient resisting and stating "no". Patient continued to refuse despite max encouragement ?  ? ?Transfers ?  ?  ?  ?  ?  ?  ?  ?  ?  ?  ?  ? ?Ambulation/Gait ?  ?  ?  ?  ?  ?  ?  ?  ? ?Stairs ?  ?  ?  ?  ?  ? ?Wheelchair Mobility ?  ? ?Modified Rankin (Stroke Patients Only) ?  ? ?  ? ?Balance   ?  ?  ?  ?  ?  ?  ?  ?  ?  ?  ?  ?  ?  ?  ?  ?  ?  ?  ?   ? ? ? ?Pertinent Vitals/Pain Pain Assessment ?Pain Assessment: No/denies pain  ? ? ?Home Living Family/patient expects to be discharged to:: Private residence ?Living Arrangements: Other (Comment) ?Available Help at Discharge: Family ?Type of Home: House ?Home Access: Stairs to enter ?Entrance Stairs-Rails: None ?Entrance Stairs-Number of Steps: 1 ?Alternate Level Stairs-Number of Steps: pt stays on main level ?Home Layout: Two level;Able to live on main level with bedroom/bathroom;Full bath on main level ?Home Equipment: Shower seat;Grab bars - tub/shower;Rolling Anjeli Casad (2 wheels) ?Additional Comments: has a caregiver 2 hours every day, helps with bathing, dressing.  pt's sister [name] does a lot for him during the day.  ?  ?Prior Function Prior Level of Function : Independent/Modified Independent ?  ?  ?  ?  ?  ?  ?Mobility Comments: per pt's sister he is typically independent and does not use RW for gait but she watches him closely. pt requires ?ADLs Comments: pt wears depends and requires assist for dressing, bathing, and toileting - typically able to assist. ?   ? ? ?Hand Dominance  ?   ? ?  ?Extremity/Trunk Assessment  ? Upper Extremity Assessment ?Upper Extremity Assessment: Defer to OT evaluation ?  ? ?Lower Extremity Assessment ?Lower Extremity Assessment: Generalized weakness (grossly 2/5 on R and 3+/5 on L) ?  ? ?Cervical / Trunk Assessment ?Cervical / Trunk Assessment: Kyphotic  ?Communication  ? Communication: Expressive difficulties  ?Cognition Arousal/Alertness: Awake/alert ?Behavior During Therapy: [facility] for tasks assessed/performed ?Overall Cognitive Status: History of cognitive impairments - at baseline ?  ?  ?  ?  ?  ?  ?  ?  ?  ?  ?  ?  ?  ?  ?  ?  ?General Comments: no family present during eval ?  ?  ? ?  ?General Comments   ? ?  ?Exercises    ? ?Assessment/Plan  ?  ?PT Assessment Patient needs continued PT services  ?PT Problem List Decreased strength;Decreased activity tolerance;Decreased balance;Decreased mobility;Decreased cognition;Decreased knowledge of use of DME;Decreased safety awareness;Decreased knowledge of precautions ? ?   ?  ?PT Treatment Interventions DME instruction;Gait training;Functional mobility training;Therapeutic activities;Therapeutic exercise;Balance training;Patient/family education   ? ?PT Goals (Current goals can be found in the Care Plan section)  ?Acute Rehab PT Goals ?Patient Stated Goal: did not state ?PT Goal Formulation: Patient unable to participate in goal setting ?Time For Goal Achievement: 04/23/21 ?Potential to Achieve Goals: Fair ? ?  ?Frequency Min 3X/week ?  ? ? ?Co-evaluation   ?  ?  ?  ?  ? ? ?  ?AM-PAC PT "6 Clicks" Mobility  ?Outcome Measure Help needed turning from your back to your side while in a flat bed without using bedrails?: A Little ?Help needed moving from lying on your back to sitting on the side of a flat bed without using bedrails?: A Little ?Help needed moving to and from a bed to a chair (including a wheelchair)?: A Lot ?Help needed standing up from a chair using your arms (e.g., wheelchair or  bedside chair)?: A Lot ?Help needed to walk in hospital room?: A Lot ?Help needed climbing 3-5 steps with a railing? : Total ?6 Click Score: 13 ? ?  ?End of Session   ?Activity Tolerance: Patient tolerated treatment well ?Patient left: in bed;with bed alarm set;with call bell/phone within reach ?Nurse Communication: Mobility status ?PT Visit Diagnosis: Muscle weakness (generalized) (M62.81) ?  ? ?Time: 9563-8756 ?PT Time Calculation (min) (ACUTE ONLY): 23 min ? ? ?Charges:   PT Evaluation ?$PT Eval Moderate Complexity: 1 Mod ?  ?  ?   ? ? ?Quillan Whitter A. Dan Humphreys, PT, DPT ?Acute Rehabilitation Services ?Pager [phone] ?Office [phone] ? ? ?Sabin Gibeault A Tagen Brethauer ?04/09/2021, 9:24 AM ? ?

## 2021-04-10 LAB — GLUCOSE, CAPILLARY
Glucose-Capillary: 163 mg/dL — ABNORMAL HIGH (ref 70–99)
Glucose-Capillary: 172 mg/dL — ABNORMAL HIGH (ref 70–99)
Glucose-Capillary: 236 mg/dL — ABNORMAL HIGH (ref 70–99)
Glucose-Capillary: 323 mg/dL — ABNORMAL HIGH (ref 70–99)

## 2021-04-10 NOTE — Progress Notes (Signed)
Physical Therapy Treatment ?Patient Details ?Name: Jermaine Nixon ?MRN: [id number] ?DOB: 1945-04-12 ?Today's Date: 04/10/2021 ? ? ?History of Present Illness 76 y/o male presented to ED on 04/06/21 for weakness, hypotension, and shaking. Admitted for AKI with lactic acidosis and dehydration. CXR negative. S/p EGD with foreign body removal on 4/7. PMH: T2DM, mental disability, BPH, HTN ? ?  ?PT Comments  ? ? Continuing work on functional mobility and activity tolerance;  Pt's sister [name] was present and very helpful during session, especially to reassure pt that it's important to get up and move; Able to get OOB to chair today with 2 person heavy assist; Mod assist for sit to stand with heavy posterior bias, and 2 person Mod/Max assist to take pivot steps bed to chair with RW;  ? ?At his current functional level, [name] cannot meet his current mobility and ADL needs; She agrees to looking for a SNF for post-acute rehab to maximize independence and safety with mobility and ADLs prior to going home  ?Recommendations for follow up therapy are one component of a multi-disciplinary discharge planning process, led by the attending physician.  Recommendations may be updated based on patient status, additional functional criteria and insurance authorization. ? ?Follow Up Recommendations ? Skilled nursing-short term rehab (<3 hours/day) ?  ?  ?Assistance Recommended at Discharge Frequent or constant Supervision/Assistance  ?Patient can return home with the following Two people to help with walking and/or transfers;Two people to help with bathing/dressing/bathroom ?  ?Equipment Recommendations ? Rolling walker (2 wheels);BSC/3in1;Wheelchair (measurements PT);Wheelchair cushion (measurements PT)  ?  ?Recommendations for Other Services   ? ? ?  ?Precautions / Restrictions Precautions ?Precautions: Fall ?Precaution Comments: cognitive disability  ?  ? ?Mobility ? Bed Mobility ?Overal bed mobility: Needs Assistance ?Bed Mobility: Supine  to Sit ?  ?  ?Supine to sit: Min assist, Mod assist ?  ?  ?General bed mobility comments: Min handheld assist to pull to sit; then mod assist and use of bed pad to help scoot hips to EOB ?  ? ?Transfers ?Overall transfer level: Needs assistance ?Equipment used: Rolling walker (2 wheels) ?Transfers: Sit to/from Stand, Bed to chair/wheelchair/BSC ?Sit to Stand: Mod assist, +2 safety/equipment ?  ?Step pivot transfers: +2 physical assistance, Max assist ?  ?  ?  ?General transfer comment: Heavy mod assist to balance and get weight forward over feet fo rsit to stand; Max assist to take steps to the chair, and then Max assist to turn hips in prep for sitting ?  ? ?Ambulation/Gait ?  ?  ?  ?  ?  ?  ?  ?  ? ? ?Stairs ?  ?  ?  ?  ?  ? ? ?Wheelchair Mobility ?  ? ?Modified Rankin (Stroke Patients Only) ?  ? ? ?  ?Balance Overall balance assessment: Needs assistance ?  ?Sitting balance-Leahy Scale: Fair ?  ?  ?  ?Standing balance-Leahy Scale: Zero ?Standing balance comment: Required physical assist to keep balance; posterior bias ?  ?  ?  ?  ?  ?  ?  ?  ?  ?  ?  ?  ? ?  ?Cognition Arousal/Alertness: Awake/alert ?Behavior During Therapy: [facility] for tasks assessed/performed ?Overall Cognitive Status: History of cognitive impairments - at baseline ?  ?  ?  ?  ?  ?  ?  ?  ?  ?  ?  ?  ?  ?  ?  ?  ?  ?  ?  ? ?  ?  Exercises   ? ?  ?General Comments   ?  ?  ? ?Pertinent Vitals/Pain Pain Assessment ?Pain Assessment: No/denies pain  ? ? ?Home Living   ?  ?  ?  ?  ?  ?  ?  ?  ?  ?   ?  ?Prior Function    ?  ?  ?   ? ?PT Goals (current goals can now be found in the care plan section) Acute Rehab PT Goals ?Patient Stated Goal: agreed to get up ?PT Goal Formulation: Patient unable to participate in goal setting ?Time For Goal Achievement: 04/23/21 ?Potential to Achieve Goals: Fair ?Progress towards PT goals: Progressing toward goals ? ?  ?Frequency ? ? ? Min 3X/week ? ? ? ?  ?PT Plan Discharge plan needs to be updated  ? ? ?Co-evaluation   ?   ?  ?  ?  ? ?  ?AM-PAC PT "6 Clicks" Mobility   ?Outcome Measure ? Help needed turning from your back to your side while in a flat bed without using bedrails?: A Little ?Help needed moving from lying on your back to sitting on the side of a flat bed without using bedrails?: A Little ?Help needed moving to and from a bed to a chair (including a wheelchair)?: A Lot ?Help needed standing up from a chair using your arms (e.g., wheelchair or bedside chair)?: Total ?Help needed to walk in hospital room?: Total ?Help needed climbing 3-5 steps with a railing? : Total ?6 Click Score: 11 ? ?  ?End of Session Equipment Utilized During Treatment: Gait belt ?Activity Tolerance: Patient tolerated treatment well ?Patient left: in chair;with call bell/phone within reach;with chair alarm set;with family/visitor present ?Nurse Communication: Mobility status ?PT Visit Diagnosis: Muscle weakness (generalized) (M62.81) ?  ? ? ?Time: 9563-8756 ?PT Time Calculation (min) (ACUTE ONLY): 43 min ? ?Charges:  $Gait Training: 8-22 mins ?$Therapeutic Activity: 23-37 mins          ?          ? ?Van Clines, PT  ?Acute Rehabilitation Services ?Office [phone] ? ? ? ?Levi Aland ?04/10/2021, 6:11 PM ? ?

## 2021-04-10 NOTE — Progress Notes (Signed)
?PROGRESS NOTE ? ? ? ?Jermaine Nixon  [id number] DOB: 11-11-1945 DOA: 04/06/2021 ?PCP: Saintclair Halsted, FNP  ? ?  ?Brief Narrative:  ?Patient is a 76 year old male with diabetes mellitus type 2, mental disability, BPH, hypertension presented to ED with acute onset of generalized weakness, hypotension and shaking while he was eating.  Patient is fairly poor historian due to mental disability.  He was noted to have mild hypoxia requiring O2 at 3 L and subsequently sats were 95 to 97% on 2 L.  No chest pain or palpitations, fevers or chills. ?Labs showed hyponatremia sodium 131, hyperkalemia 5.6.  Glucose 202, creatinine 1.55.  Creatinine was 0.8 on 02/11/2021.  Lactic acid 4.9 ?WBCs 10.7, troponin 46-47.  EKG had shown inferior lateral T waves.  Chest x-ray showed no acute cardiopulmonary disease. ?Patient was placed on IV fluids, started on broad-spectrum antibiotics. ? ? ?New events last 24 hours / Subjective: ?Was quite distended yesterday and unable to walk, which is different from baseline. DC was cancelled. Asked PT to see him today again, he refused this AM. Sister now here, will try to get them to see again. He is ambulatory at baseline, lives with only his sister who is not particularly strong or confident she can lift him alone.  ? ?Assessment & Plan: ?  ?Principal Problem: ?  Lactic acidosis ?            Resolved ?            Given imaging, will give 5 d of doxycycline 100 mg bid; on day 2/5 today ?  ?Active Problems: ?  Deconditioning ?            Messaged PT again, may have to dc to SNF ?             ?  Constipation ?            Encourage PO hydration ?            Miralax BID ?            Enema prn ?            Colace bid prn ?  ?  Dehydration ?            Encourage hydration ?  ?  AKI (acute kidney injury) ([place]) ?            Resolved ?  ?  Hyperkalemia ?            Resolved ?  ?  Hypoxemia ?            Resolved ?  ?  Type 2 diabetes mellitus without complications ([place]) ?            Cont sliding scale ?  ?   Essential hypertension ?            Lotensin 20 mg daily ?            Hydrochlorothiazide 25 mg daily ?  ?  Dyslipidemia ?            Lipitor 40 mg daily ?  ?  Anemia ?            Stable  ?  ?  Heme positive stool ?            EGD unremarkable other than foreign body ?            Unable to complete colonoscopy due to inadequate prep ?  Hemoglobin stable, will manage outpatient ?  ?  ?DVT prophylaxis: Lovenox ?Code Status: full  ?Family Communication: Sister at bedside again ?Coming From: home ?Disposition Plan: probably SNF ?Barriers to Discharge: unsafe DC home at this time ?  ?Consultants:  ?GI ?  ?Procedures:  ?EGD 04/08/21; failed colonoscopy due to stool burden ? ?Antimicrobials:  ?Anti-infectives (From admission, onward)  ? ? Start     Dose/Rate Route Frequency Ordered Stop  ? 04/09/21 2200  doxycycline (VIBRA-TABS) tablet 100 mg       ? 100 mg Oral Every 12 hours 04/09/21 1737    ? 04/09/21 0000  vancomycin (VANCOREADY) IVPB 1250 mg/250 mL  Status:  Discontinued       ? 1,250 mg ?166.7 mL/hr over 90 Minutes Intravenous Every 48 hours 04/06/21 2306 04/09/21 1017  ? 04/09/21 0000  doxycycline (VIBRA-TABS) 100 MG tablet       ? 100 mg Oral 2 times daily 04/09/21 1507 04/14/21 2359  ? 04/07/21 1200  ceFEPIme (MAXIPIME) 2 g in sodium chloride 0.9 % 100 mL IVPB  Status:  Discontinued       ? 2 g ?200 mL/hr over 30 Minutes Intravenous Every 12 hours 04/06/21 2306 04/09/21 1017  ? 04/06/21 2300  ceFEPIme (MAXIPIME) 2 g in sodium chloride 0.9 % 100 mL IVPB       ? 2 g ?200 mL/hr over 30 Minutes Intravenous  Once 04/06/21 2249 04/07/21 0012  ? 04/06/21 2300  metroNIDAZOLE (FLAGYL) IVPB 500 mg       ? 500 mg ?100 mL/hr over 60 Minutes Intravenous  Once 04/06/21 2249 04/07/21 0135  ? 04/06/21 2300  vancomycin (VANCOCIN) IVPB 1000 mg/200 mL premix       ? 1,000 mg ?200 mL/hr over 60 Minutes Intravenous  Once 04/06/21 2249 04/07/21 0240  ? ?  ? ? ? ?Objective: ?Vitals:  ? 04/09/21 1618 04/09/21 2007 04/10/21 0542  04/10/21 4259  ?BP: 131/78 137/83 (!) 141/75 (!) 146/67  ?Pulse: 94 93 69 79  ?Resp: 17 18 18 18   ?Temp:  97.8 ?F (36.6 ?C) 97.6 ?F (36.4 ?C) 97.6 ?F (36.4 ?C)  ?TempSrc:  Axillary  Axillary  ?SpO2: 98% 99% 100% 100%  ?Weight:   57.1 kg   ?Height:      ? ? ?Intake/Output Summary (Last 24 hours) at 04/10/2021 1456 ?Last data filed at 04/10/2021 0600 ?Gross per 24 hour  ?Intake 640 ml  ?Output 675 ml  ?Net -35 ml  ? ?Filed Weights  ? 04/06/21 2127 04/07/21 0200 04/10/21 0542  ?Weight: 59.3 kg 60 kg 57.1 kg  ? ? ?Examination:  ?General exam: Appears calm and comfortable  ?Respiratory system: Clear to auscultation. Respiratory effort normal. No respiratory distress. No conversational dyspnea.  ?Cardiovascular system: S1 & S2 heard, RRR. No murmurs. No pedal edema. ?Gastrointestinal system: Abdomen is mildly distended, soft and nontender. Normal bowel sounds heard. ?Central nervous system: No focal neurological deficits. Speech clear.  ?Extremities: Symmetric in appearance  ?Skin: No rashes, lesions or ulcers on exposed skin  ?Psychiatry: Difficult to ascertain judgment. Mood & affect appropriate.  ? ?Data Reviewed: I have personally reviewed following labs and imaging studies ? ?CBC: ?Recent Labs  ?Lab 04/06/21 ?2221 04/07/21 ?0437 04/08/21 ?0243 04/09/21 ?0258  ?WBC 10.7* 7.8 6.2 7.4  ?NEUTROABS 8.1*  --   --   --   ?HGB 10.0* 9.8* 9.3* 10.5*  ?HCT 29.0* 29.6* 27.4* 31.9*  ?MCV 89.5 90.0 89.3 91.1  ?PLT 234 191 218 210  ? ?  Basic Metabolic Panel: ?Recent Labs  ?Lab 04/06/21 ?2221 04/07/21 ?0437 04/08/21 ?0243 04/09/21 ?0258  ?NA 131* 133* 141 136  ?K 5.6* 4.5 4.0 4.7  ?CL 92* 100 107 104  ?CO2 26 24 27 24   ?GLUCOSE 202* 170* 136* 160*  ?BUN 58* 46* 20 9  ?CREATININE 1.55* 1.25* 0.85 0.83  ?CALCIUM 9.6 9.4 9.0 8.9  ? ?GFR: ?Estimated Creatinine Clearance: 59.2 mL/min (by C-G formula based on SCr of 0.83 mg/dL). ?Liver Function Tests: ?Recent Labs  ?Lab 04/06/21 ?2221  ?AST 26  ?ALT 16  ?ALKPHOS 53  ?BILITOT 0.5  ?PROT 7.0   ?ALBUMIN 3.5  ? ?Recent Labs  ?Lab 04/06/21 ?2221  ?LIPASE 33  ? ?CBG: ?Recent Labs  ?Lab 04/09/21 ?1113 04/09/21 ?1620 04/09/21 ?2045 04/10/21 ?0740 04/10/21 ?1141  ?GLUCAP 201* 274* 293* 163* 323*  ? ?Sepsis Labs: ?Recent Labs  ?Lab 04/06/21 ?2221 04/07/21 ?[id number] 04/07/21 ?[id number] 04/07/21 ?0710  ?PROCALCITON  --   --   --  <0.10  ?LATICACIDVEN 4.9* 3.9* 3.1* 1.1  ? ? ?Recent Results (from the past 240 hour(s))  ?Culture, blood (routine x 2)     Status: None (Preliminary result)  ? Collection Time: 04/06/21 10:02 PM  ? Specimen: BLOOD LEFT FOREARM  ?Result Value Ref Range Status  ? Specimen Description   Final  ?  BLOOD LEFT FOREARM ?Performed at [facility], [address], [place], [place] [address] ?  ? Special Requests   Final  ?  BOTTLES DRAWN AEROBIC AND ANAEROBIC Blood Culture adequate volume ?Performed at [facility], [address], [place], [place] [address] ?  ? Culture   Final  ?  NO GROWTH 3 DAYS ?Performed at [facility], [place] [address] [place], [place] [address] ?  ? Report Status PENDING  Incomplete  ?Culture, blood (routine x 2)     Status: None (Preliminary result)  ? Collection Time: 04/06/21 10:18 PM  ? Specimen: BLOOD LEFT FOREARM  ?Result Value Ref Range Status  ? Specimen Description   Final  ?  BLOOD LEFT FOREARM ?Performed at [facility], [address] [place], [place] [address] ?  ? Special Requests   Final  ?  BOTTLES DRAWN AEROBIC ONLY Blood Culture results may not be optimal due to an inadequate volume of blood received in culture bottles ?Performed at [facility], [address] [place], [place] [address] ?  ? Culture   Final  ?  NO GROWTH 3 DAYS ?Performed at [facility], [place] [address], [place],  [address] ?  ? Report Status PENDING  Incomplete  ?Resp Panel by RT-PCR (Flu A&B, Covid) Nasopharyngeal Swab     Status: None  ? Collection Time: 04/06/21 11:20 PM  ? Specimen: Nasopharyngeal Swab; Nasopharyngeal(NP)  swabs in vial transport medium  ?Result Value Ref Range Status  ? SARS Coronavirus 2 by RT PCR NEGATIVE NEGATIVE Final  ?  Comment: (NOTE) ?SARS-CoV-2 target nucleic acids are NOT DETECTED. ? ?The SARS-CoV-2

## 2021-04-10 NOTE — TOC Progression Note (Signed)
Transition of Care (TOC) - Progression Note  ?Donn Pierini Charity fundraiser, BSN ?Transitions of Care ?Unit 4E- RN Case Manager ?See Treatment Team for direct phone #  ? ? ?Patient Details  ?Name: Jermaine Nixon ?MRN: [id number] ?Date of Birth: Oct 13, 1945 ? ?Transition of Care (TOC) CM/SW Contact  ?Darrold Span, RN ?Phone Number: ?04/10/2021, 12:54 PM ? ?Clinical Narrative:    ?0900-Follow up done with [facility] regarding pending HH referral, spoke w/ Amy. Per Amy Iantha Fallen is unable to accept referral at this time due to low staffing this week 2/2 to [employer].  ?CM reached out to [facility] with [facility]- and [facility] can accept for HHPT/OT needs. They will follow for start of care at discharge.  ? ? ?Expected Discharge Plan: Home w Home Health Services ?Barriers to Discharge: Continued Medical Work up ? ?Expected Discharge Plan and Services ?Expected Discharge Plan: Home w Home Health Services ?  ?Discharge Planning Services: CM Consult ?Post Acute Care Choice: Home Health ?Living arrangements for the past 2 months: Single Family Home ?Expected Discharge Date: 04/09/21               ?  ?  ?  ?  ?  ?HH Arranged: PT, OT ?HH Agency: [facility] ?Date HH Agency Contacted: 04/10/21 ?Time HH Agency Contacted: 1205 ?Representative spoke with at [facility] Agency: Kandee Keen ? ? ?Social Determinants of Health (SDOH) Interventions ?  ? ?Readmission Risk Interventions ?   ? View : No data to display.  ?  ?  ?  ? ? ?

## 2021-04-10 NOTE — Progress Notes (Signed)
Speech Language Pathology Treatment: Dysphagia  ?Patient Details ?Name: Jermaine Nixon ?MRN: [id number] ?DOB: 09/19/1945 ?Today's Date: 04/10/2021 ?Time: [phone] ?SLP Time Calculation (min) (ACUTE ONLY): 11 min ? ?Assessment / Plan / Recommendation ?Clinical Impression ? Pt alert, cooperative, able to feed himself crackers and drink thin liquids with no s/s of dysphagia today.  He was positioned upright and needed only occasional help with set-up for feeding.  EGD 4/7 removed large ball of rolled-up plastic from stomach. He is likely at baseline with regard to swallowing/feeding. Breath sounds are clear and he does not appear to be aspirating. Recommend continuing regular solids/finger foods when possible.  Thin liquids. No further SLP f/u is needed. Our service will sign off. ? ?  ?HPI HPI: Jermaine Nixon is a 76 y.o. male with a past medical history significant for type 2 diabetes, mental disability, BPH and hypertension, who presented to the ER initially with acute onset of generalized weakness and associated hypotension and shaking with eating. ?  ?   ?SLP Plan ? All goals met ? ?  ?  ?Recommendations for follow up therapy are one component of a multi-disciplinary discharge planning process, led by the attending physician.  Recommendations may be updated based on patient status, additional functional criteria and insurance authorization. ?  ? ?Recommendations  ?Diet recommendations: Regular;Thin liquid ?Liquids provided via: Straw;Cup ?Medication Administration: Whole meds with liquid ?Supervision: Patient able to self feed;Staff to assist with self feeding ?Compensations: Minimize environmental distractions ?Postural Changes and/or Swallow Maneuvers: Seated upright 90 degrees  ?   ?    ?   ? ? ? ? Oral Care Recommendations: Oral care BID ?Follow Up Recommendations: No SLP follow up ?SLP Visit Diagnosis: Dysphagia, unspecified (R13.10) ?Plan: All goals met ? ? ? ? ?  ?  ?Jermaine Nixon L. Buel Molder, MA CCC/SLP ?Acute  Rehabilitation Services ?Office number [phone] ?Pager [phone] ? ? ?Jermaine Nixon Jermaine Nixon ? ?04/10/2021, 9:59 AM ?

## 2021-04-10 NOTE — Progress Notes (Addendum)
PT Cancellation Note ? ?Patient Details ?Name: Jermaine Nixon ?MRN: [id number] ?DOB: [birth year] ? ? ?Cancelled Treatment:    Reason Eval/Treat Not Completed: Patient declined, no reason specified ? ?Attempted further PT assessment of mobility, and he refuses;  ? ?Plan to retry later today -- would like to see if having family present will facilitate his participation;  ? ?Levi Aland ?04/10/2021, 10:19 AM ? ?Addendum:  ? ?Noted current plan is for pt to return home with family assist and HHPT f/u;  ?If pt's sister and family are unable to meet his needs at this time, we will need to consider a post-acute rehab stay at SNF to maximize independence and safety with mobility and ADLs ? ?Van Clines, PT  ?Acute Rehabilitation Services ?Office [phone] ?Pager [phone] ?

## 2021-04-11 LAB — CBC
HCT: 34.3 % — ABNORMAL LOW (ref 39.0–52.0)
Hemoglobin: 11.6 g/dL — ABNORMAL LOW (ref 13.0–17.0)
MCH: 30.4 pg (ref 26.0–34.0)
MCHC: 33.8 g/dL (ref 30.0–36.0)
MCV: 89.8 fL (ref 80.0–100.0)
Platelets: 222 10*3/uL (ref 150–400)
RBC: 3.82 MIL/uL — ABNORMAL LOW (ref 4.22–5.81)
RDW: 15.4 % (ref 11.5–15.5)
WBC: 7.2 10*3/uL (ref 4.0–10.5)
nRBC: 0 % (ref 0.0–0.2)

## 2021-04-11 LAB — GLUCOSE, CAPILLARY
Glucose-Capillary: 194 mg/dL — ABNORMAL HIGH (ref 70–99)
Glucose-Capillary: 388 mg/dL — ABNORMAL HIGH (ref 70–99)
Glucose-Capillary: 256 mg/dL — ABNORMAL HIGH (ref 70–99)

## 2021-04-11 LAB — BASIC METABOLIC PANEL
Anion gap: 7 (ref 5–15)
BUN: 8 mg/dL (ref 8–23)
CO2: 28 mmol/L (ref 22–32)
Calcium: 9.2 mg/dL (ref 8.9–10.3)
Chloride: 103 mmol/L (ref 98–111)
Creatinine, Ser: 0.84 mg/dL (ref 0.61–1.24)
GFR, Estimated: >60 mL/min (ref >60)
Glucose, Bld: 182 mg/dL — ABNORMAL HIGH (ref 70–99)
Potassium: 4.2 mmol/L (ref 3.5–5.1)
Sodium: 138 mmol/L (ref 135–145)

## 2021-04-11 LAB — BRAIN NATRIURETIC PEPTIDE: B Natriuretic Peptide: 428.2 pg/mL — ABNORMAL HIGH (ref 0.0–100.0)

## 2021-04-11 LAB — MAGNESIUM: Magnesium: 1.6 mg/dL — ABNORMAL LOW (ref 1.7–2.4)

## 2021-04-11 MED ORDER — POLYETHYLENE GLYCOL 3350 17 G PO PACK
17.0000 g | PACK | Freq: Every day | ORAL | Status: DC
  Administered 2021-04-12 – 2021-04-13 (×2): 17 g via ORAL
  Filled 2021-04-11 (×2): qty 1

## 2021-04-11 MED ORDER — DOCUSATE SODIUM 100 MG PO CAPS: 200.0000 mg | ORAL_CAPSULE | Freq: Two times a day (BID) | ORAL | Status: DC

## 2021-04-11 MED ORDER — BISACODYL 5 MG PO TBEC: 10.0000 mg | DELAYED_RELEASE_TABLET | Freq: Once | ORAL | Status: DC

## 2021-04-11 MED ORDER — DOCUSATE SODIUM 100 MG PO CAPS
100.0000 mg | ORAL_CAPSULE | Freq: Every day | ORAL | Status: DC
Start: 2021-04-11 — End: 2021-04-13
  Administered 2021-04-11 – 2021-04-13 (×3): 100 mg via ORAL
  Filled 2021-04-11 (×3): qty 1

## 2021-04-11 MED ORDER — MAGNESIUM HYDROXIDE 400 MG/5ML PO SUSP: 30.0000 mL | Freq: Once | ORAL | Status: DC

## 2021-04-11 MED ORDER — MAGNESIUM SULFATE 2 GM/50ML IV SOLN
2.0000 g | Freq: Once | INTRAVENOUS | Status: AC
  Administered 2021-04-11: 2 g via INTRAVENOUS
  Filled 2021-04-11: qty 50

## 2021-04-11 NOTE — TOC Progression Note (Addendum)
Transition of Care (TOC) - Initial/Assessment Note  ? ? ?Patient Details  ?Name: Jermaine Nixon ?MRN: [id number] ?Date of Birth: [birth year] ? ?Transition of Care (TOC) CM/SW Contact:    ?Catalina Pizza Valon Glasscock, LCSWA ?Phone Number: ?04/11/2021, 10:16 AM ? ?Clinical Narrative:                 ?CSW attempted to contact the patient's sister, [name], after reading the PT is now recommending SNF.  There was no answer.  CSW left a VM for the sister requesting a returned call.  ? ?1300-  CSW received a returned call from the patient's caregiver/ sister. The sister is in agreement with SNF placement and would like a facility in [place] or [address] CSW explained the insurance authorization process.  ? ?Expected Discharge Plan: Home w Home Health Services ?Barriers to Discharge: Continued Medical Work up ? ? ?Patient Goals and CMS Choice ?Patient states their goals for this hospitalization and ongoing recovery are:: return home ?CMS Medicare.gov Compare Post Acute Care list provided to:: Patient ?Choice offered to / list presented to : Patient ? ?Expected Discharge Plan and Services ?Expected Discharge Plan: Home w Home Health Services ?  ?Discharge Planning Services: CM Consult ?Post Acute Care Choice: Home Health ?Living arrangements for the past 2 months: Single Family Home ?Expected Discharge Date: 04/09/21               ?  ?  ?  ?  ?  ?HH Arranged: PT, OT ?HH Agency: [facility] ?Date HH Agency Contacted: 04/10/21 ?Time HH Agency Contacted: 1205 ?Representative spoke with at [facility] Agency: Kandee Keen ? ?Prior Living Arrangements/Services ?Living arrangements for the past 2 months: Single Family Home ?Lives with:: Self ?Patient language and need for interpreter reviewed:: Yes ?       ?Need for Family Participation in Patient Care: Yes (Comment) ?Care giver support system in place?: Yes (comment) ?Current home services: DME ?Criminal Activity/Legal Involvement Pertinent to Current Situation/Hospitalization: No - Comment as  needed ? ?Activities of Daily Living ?Home Assistive Devices/Equipment: Blood pressure cuff, CBG Meter, Scales, Walker (specify type), Wheelchair, Grab bars around toilet, Grab bars in shower, Hand-held shower hose ?ADL Screening (condition at time of admission) ?Patient's cognitive ability adequate to safely complete daily activities?: No ?Is the patient deaf or have difficulty hearing?: No ?Does the patient have difficulty seeing, even when wearing glasses/contacts?: No ?Does the patient have difficulty concentrating, remembering, or making decisions?: Yes ?Patient able to express need for assistance with ADLs?: No ?Does the patient have difficulty dressing or bathing?: Yes ?Independently performs ADLs?: No ?Communication: Dependent ?Is this a change from baseline?: Pre-admission baseline ?Dressing (OT): Dependent ?Is this a change from baseline?: Pre-admission baseline ?Grooming: Dependent ?Is this a change from baseline?: Pre-admission baseline ?Feeding: Dependent ?Is this a change from baseline?: Pre-admission baseline ?Bathing: Dependent ?Is this a change from baseline?: Pre-admission baseline ?Toileting: Dependent ?Is this a change from baseline?: Pre-admission baseline ?In/Out Bed: Dependent ?Is this a change from baseline?: Pre-admission baseline ?Walks in Home: Dependent ?Is this a change from baseline?: Pre-admission baseline ?Does the patient have difficulty walking or climbing stairs?: Yes ?Weakness of Legs: Both ?Weakness of Arms/Hands: Both ? ?Permission Sought/Granted ?  ?  ?   ?   ?   ?   ? ?Emotional Assessment ?  ?  ?  ?  ?Alcohol / Substance Use: Not Applicable ?Psych Involvement: No (comment) ? ?Admission diagnosis:  Lactic acidosis [E87.20] ?Weakness [R53.1] ?Patient Active Problem List  ?  Diagnosis Date Noted  ? Hyperkalemia 04/07/2021  ? Hypoxemia 04/07/2021  ? Type 2 diabetes mellitus without complications (HCC) 04/07/2021  ? Essential hypertension 04/07/2021  ? Dyslipidemia 04/07/2021  ?  Anemia   ? Heme positive stool   ? Dehydration 02/09/2021  ? Lactic acidosis 02/09/2021  ? Hypernatremia 02/09/2021  ? AKI (acute kidney injury) (HCC) 02/09/2021  ? Hypercalcemia 02/09/2021  ? Generalized weakness 02/09/2021  ? Hypertension   ? HLD (hyperlipidemia)   ? Type 2 diabetes mellitus with chronic kidney disease, with long-term current use of insulin (HCC)   ? ?PCP:  [name], FNP ?Pharmacy:   ?[id number] - [place], [place] - [address] AT [facility] [address] ?[address] ?[place] [address] ?Phone: [phone] Fax: [phone] ? ? ? ? ?Social Determinants of Health (SDOH) Interventions ?  ? ?Readmission Risk Interventions ?   ? View : No data to display.  ?  ?  ?  ? ? ? ?

## 2021-04-11 NOTE — Care Management Important Message (Signed)
Important Message ? ?Patient Details  ?Name: Jermaine Nixon ?MRN: [id number] ?Date of Birth: 01-26-1945 ? ? ?Medicare Important Message Given:  Yes ? ? ? ? ?Bracken Moffa ?04/11/2021, 4:22 PM ?

## 2021-04-11 NOTE — Progress Notes (Signed)
Occupational Therapy Treatment ?Patient Details ?Name: Jermaine Nixon ?MRN: [id number] ?DOB: April 01, 1945 ?Today's Date: 04/11/2021 ? ? ?History of present illness 76 y/o male presented to ED on 04/06/21 for weakness, hypotension, and shaking. Admitted for AKI with lactic acidosis and dehydration. CXR negative. S/p EGD with foreign body removal on 4/7. PMH: T2DM, mental disability, BPH, HTN ?  ?OT comments ? Patient received in supine and no family present. Patient agreed to get to EOB with mod assist and was min guard for sitting balance. Patient stood from EOB with mod assist and was discovered to have had runny BM.  Patient stood again to allow for cleaning using RW for support and transferred to recliner with mod assist with RW. Patient began asking to return to bed and appeared to begin to get agitated and was assisted back to bed. Patient making good gains. Acute OT to continue to follow.   ? ?Recommendations for follow up therapy are one component of a multi-disciplinary discharge planning process, led by the attending physician.  Recommendations may be updated based on patient status, additional functional criteria and insurance authorization. ?   ?Follow Up Recommendations ? Home health OT  ?  ?Assistance Recommended at Discharge Frequent or constant Supervision/Assistance  ?Patient can return home with the following ? A lot of help with walking and/or transfers;A lot of help with bathing/dressing/bathroom;Assistance with feeding;Assistance with cooking/housework;Direct supervision/assist for medications management;Assist for transportation;Help with stairs or ramp for entrance;Direct supervision/assist for financial management ?  ?Equipment Recommendations ? BSC/3in1  ?  ?Recommendations for Other Services   ? ?  ?Precautions / Restrictions Precautions ?Precautions: Fall ?Precaution Comments: cognitive disability ?Restrictions ?Weight Bearing Restrictions: No  ? ? ?  ? ?Mobility Bed Mobility ?Overal bed mobility:  Needs Assistance ?Bed Mobility: Supine to Sit, Sit to Supine ?  ?  ?Supine to sit: Min assist, Mod assist ?Sit to supine: Min assist ?  ?General bed mobility comments: assistance to scoot forward on bed, min assist with BLE to return to supine ?  ? ?Transfers ?Overall transfer level: Needs assistance ?Equipment used: Rolling walker (2 wheels) ?Transfers: Sit to/from Stand, Bed to chair/wheelchair/BSC ?Sit to Stand: Mod assist ?  ?  ?Step pivot transfers: Mod assist ?  ?  ?General transfer comment: mod assist with assistance to shift weight and manage RW ?  ?  ?Balance Overall balance assessment: Needs assistance ?  ?Sitting balance-Leahy Scale: Fair ?Sitting balance - Comments: able to sit on EOB with min guard ?  ?Standing balance support: Bilateral upper extremity supported ?Standing balance-Leahy Scale: Poor ?Standing balance comment: reliant on RW for balance ?  ?  ?  ?  ?  ?  ?  ?  ?  ?  ?  ?   ? ?ADL either performed or assessed with clinical judgement  ? ?ADL Overall ADL's : Needs assistance/impaired ?  ?  ?Grooming: Therapist, nutritional;Moderate assistance;Bed level ?Grooming Details (indicate cue type and reason): began washing face then indicated he wanted COTA to help ?  ?  ?Lower Body Bathing: Maximal assistance;Sit to/from stand ?Lower Body Bathing Details (indicate cue type and reason): stood at bedside to perform cleaning from [facility] in bed ?Upper Body Dressing : Moderate assistance;Sitting ?Upper Body Dressing Details (indicate cue type and reason): in recliner ?  ?  ?Toilet Transfer: Moderate assistance;Rolling walker (2 wheels) ?Toilet Transfer Details (indicate cue type and reason): simulated to recliner ?  ?  ?  ?  ?  ?General ADL Comments: stood for cleaning  after BM in bed and performed transfer to recliner with RW ?  ? ?Extremity/Trunk Assessment   ?  ?  ?  ?  ?  ? ?Vision   ?  ?  ?Perception   ?  ?Praxis   ?  ? ?Cognition Arousal/Alertness: Awake/alert ?Behavior During Therapy: [facility] for tasks  assessed/performed ?Overall Cognitive Status: History of cognitive impairments - at baseline ?  ?  ?  ?  ?  ?  ?  ?  ?  ?  ?  ?  ?  ?  ?  ?  ?General Comments: no family present, patient responded with nodding head, when verbal he was difficult to understand ?  ?  ?   ?Exercises   ? ?  ?Shoulder Instructions   ? ? ?  ?General Comments    ? ? ?Pertinent Vitals/ Pain       Pain Assessment ?Pain Assessment: No/denies pain ? ?Home Living   ?  ?  ?  ?  ?  ?  ?  ?  ?  ?  ?  ?  ?  ?  ?  ?  ?  ?  ? ?  ?Prior Functioning/Environment    ?  ?  ?  ?   ? ?Frequency ? Min 2X/week  ? ? ? ? ?  ?Progress Toward Goals ? ?OT Goals(current goals can now be found in the care plan section) ? Progress towards OT goals: Progressing toward goals ? ?Acute Rehab OT Goals ?OT Goal Formulation: With patient ?Time For Goal Achievement: 04/23/21 ?Potential to Achieve Goals: Fair ?ADL Goals ?Pt Will Perform Upper Body Dressing: with mod assist;sitting;standing;bed level ?Pt Will Perform Lower Body Dressing: with mod assist;sitting/lateral leans;sit to/from stand ?Pt Will Transfer to Toilet: with mod assist;ambulating;bedside commode;stand pivot transfer ?Pt Will Perform Tub/Shower Transfer: with mod assist;with caregiver independent in assisting;shower seat;ambulating;rolling walker;Shower transfer;Tub transfer  ?Plan Discharge plan remains appropriate   ? ?Co-evaluation ? ? ?   ?  ?  ?  ?  ? ?  ?AM-PAC OT "6 Clicks" Daily Activity     ?Outcome Measure ? ? Help from another person eating meals?: A Lot ?Help from another person taking care of personal grooming?: A Lot ?Help from another person toileting, which includes using toliet, bedpan, or urinal?: A Lot ?Help from another person bathing (including washing, rinsing, drying)?: A Lot ?Help from another person to put on and taking off regular upper body clothing?: A Lot ?Help from another person to put on and taking off regular lower body clothing?: A Lot ?6 Click Score: 12 ? ?  ?End of Session  Equipment Utilized During Treatment: Gait belt;Rolling walker (2 wheels) ? ?OT Visit Diagnosis: Unsteadiness on feet (R26.81);Muscle weakness (generalized) (M62.81);Cognitive communication deficit (R41.841) ?  ?Activity Tolerance Patient tolerated treatment well ?  ?Patient Left in bed;with call bell/phone within reach;with bed alarm set ?  ?Nurse Communication Mobility status ?  ? ?   ? ?Time: 3825-0539 ?OT Time Calculation (min): 30 min ? ?Charges: OT General Charges ?$OT Visit: 1 Visit ?OT Treatments ?$Self Care/Home Management : 23-37 mins ? ?Alfonse Flavors, OTA ?Acute Rehabilitation Services  ?Pager [phone] ?Office [phone] ? ? ?Curren Mohrmann Jeannett Senior ?04/11/2021, 11:20 AM ?

## 2021-04-11 NOTE — Progress Notes (Signed)
Inpatient Diabetes Program Recommendations ? ?AACE/ADA: New Consensus Statement on Inpatient Glycemic Control (2015) ? ?Target Ranges:  Prepandial:   less than 140 mg/dL ?     Peak postprandial:   less than 180 mg/dL (1-2 hours) ?     Critically ill patients:  140 - 180 mg/dL  ? ?Lab Results  ?Component Value Date  ? GLUCAP 194 (H) 04/11/2021  ? HGBA1C 9.9 (H) 04/07/2021  ? ? ?Review of Glycemic Control ? Latest Reference Range & Units 04/10/21 07:40 04/10/21 11:41 04/10/21 16:46 04/10/21 23:30 04/11/21 07:42  ?Glucose-Capillary 70 - 99 mg/dL 382 (H) 505 (H) 397 (H) 172 (H) 194 (H)  ? ? ?Diabetes history: DM2 ?Outpatient Diabetes medications: Actos 45 mg QD, Glipizide 5 mg QAM, Metformin 500 mg BID, Jardiance 10 mg QD ?Current orders for Inpatient glycemic control: Novolog 0-9 units TID and HS ? ? ?Pt may benefit from Novolog meal coverage insulin to prevent postprandial spikes.  ?MD to assess on rounds. ? ?Note: Pt and family educated on 4/6 ? ?Thank you, ?Christena Deem RN, MSN, BC-ADM ?Inpatient Diabetes Coordinator ?Team Pager [phone] (8a-5p) ? ? ? ? ? ? ? ?

## 2021-04-11 NOTE — Care Management Important Message (Signed)
Important Message ? ?Patient Details  ?Name: Jermaine Nixon ?MRN: [id number] ?Date of Birth: 02-03-1945 ? ? ?Medicare Important Message Given:  Yes ? ? ? ? ?[name] ?04/11/2021, 4:24 PM ?

## 2021-04-11 NOTE — Progress Notes (Signed)
?PROGRESS NOTE ? ? ? ?Jermaine Nixon  HBZ:[id number] DOB: Nov 28, 1945 DOA: 04/06/2021 ?PCP: [name], FNP  ? ?  ?Brief Narrative:  ?Patient is a 76 year old male with diabetes mellitus type 2, mental disability, BPH, hypertension presented to ED with acute onset of generalized weakness, hypotension and shaking while he was eating.  Patient is fairly poor historian due to mental disability.  He was noted to have mild hypoxia requiring O2 at 3 L and subsequently sats were 95 to 97% on 2 L.  No chest pain or palpitations, fevers or chills. ?Labs showed hyponatremia sodium 131, hyperkalemia 5.6.  Glucose 202, creatinine 1.55.  Creatinine was 0.8 on 02/11/2021.  Lactic acid 4.9 ?WBCs 10.7, troponin 46-47.  EKG had shown inferior lateral T waves.  Chest x-ray showed no acute cardiopulmonary disease. ?Patient was placed on IV fluids, started on broad-spectrum antibiotics. ? ? ?Subjective: ? ?Patient in bed, appears comfortable, denies any headache, no fever, no chest pain or pressure, no shortness of breath , no abdominal pain. No new focal weakness. ? ? ?Assessment & Plan: ?  ?Severe dehydration causing AKI, hyper kalemia and lactic acidosis.  All resolved after IV fluid administration and supportive care, no signs of sepsis.  Completed 5 days of doxycycline per previous MD. ?   ? ?Deconditioning -  PT, will need SNF. ?             ?Constipation -  Encourage PO hydration, continue bowel regimen. ?     ? Heme positive stool - EGD unremarkable other than foreign body, anoscopy could not be done due to stool burden.  Outpatient GI follow-up ?             ?Hypoxemia - Resolved ?  ?Essential hypertension - Lotensin 20 mg daily + HCTZ ?              ?Dyslipidemia - Lipitor 40 mg daily ?  ?Anemia - Chronic anemia.  No acute drop.  H&H actually rising. ?  ?DM2 -  Cont sliding scale, poor outpatient control due to hyperglycemia ? ?Lab Results  ?Component Value Date  ? HGBA1C 9.9 (H) 04/07/2021  ? ?CBG (last 3)  ?Recent Labs  ?   04/10/21 ?1646 04/10/21 ?2330 04/11/21 ?0742  ?GLUCAP 236* 172* 194*  ? ? ?  ?DVT prophylaxis: Lovenox ?Code Status: full  ?Family Communication: none present ?Coming From: home ?Disposition Plan: SNF ?Barriers to Discharge: unsafe DC home at this time ?  ?Consultants:  ?GI ?  ?Procedures:  ?EGD 04/08/21; failed colonoscopy due to stool burden ? ?Antimicrobials:  ?Anti-infectives (From admission, onward)  ? ? Start     Dose/Rate Route Frequency Ordered Stop  ? 04/09/21 2200  doxycycline (VIBRA-TABS) tablet 100 mg       ? 100 mg Oral Every 12 hours 04/09/21 1737    ? 04/09/21 0000  vancomycin (VANCOREADY) IVPB 1250 mg/250 mL  Status:  Discontinued       ? 1,250 mg ?166.7 mL/hr over 90 Minutes Intravenous Every 48 hours 04/06/21 2306 04/09/21 1017  ? 04/09/21 0000  doxycycline (VIBRA-TABS) 100 MG tablet       ? 100 mg Oral 2 times daily 04/09/21 1507 04/14/21 2359  ? 04/07/21 1200  ceFEPIme (MAXIPIME) 2 g in sodium chloride 0.9 % 100 mL IVPB  Status:  Discontinued       ? 2 g ?200 mL/hr over 30 Minutes Intravenous Every 12 hours 04/06/21 2306 04/09/21 1017  ? 04/06/21 2300  ceFEPIme (  MAXIPIME) 2 g in sodium chloride 0.9 % 100 mL IVPB       ? 2 g ?200 mL/hr over 30 Minutes Intravenous  Once 04/06/21 2249 04/07/21 0012  ? 04/06/21 2300  metroNIDAZOLE (FLAGYL) IVPB 500 mg       ? 500 mg ?100 mL/hr over 60 Minutes Intravenous  Once 04/06/21 2249 04/07/21 0135  ? 04/06/21 2300  vancomycin (VANCOCIN) IVPB 1000 mg/200 mL premix       ? 1,000 mg ?200 mL/hr over 60 Minutes Intravenous  Once 04/06/21 2249 04/07/21 0240  ? ?  ? ? ? ?Objective: ?Vitals:  ? 04/10/21 [id number] 04/10/21 2106 04/11/21 [id number] 04/11/21 0944  ?BP: (!) 146/67 (!) 146/84 (!) 151/91 (!) 143/81  ?Pulse: 79 88 93 88  ?Resp: 18 18 18 20   ?Temp: 97.6 ?F (36.4 ?C) 97.9 ?F (36.6 ?C) 97.6 ?F (36.4 ?C) 98.1 ?F (36.7 ?C)  ?TempSrc: Axillary Oral  Oral  ?SpO2: 100% 100% 98% 100%  ?Weight:      ?Height:      ? ? ?Intake/Output Summary (Last 24 hours) at 04/11/2021 1039 ?Last  data filed at 04/11/2021 0900 ?Gross per 24 hour  ?Intake 884 ml  ?Output 500 ml  ?Net 384 ml  ? ?Filed Weights  ? 04/06/21 2127 04/07/21 0200 04/10/21 0542  ?Weight: 59.3 kg 60 kg 57.1 kg  ? ? ?Examination:  ? ?Awake , mildly confused, No new F.N deficits, moves all 4 extremities on commands ?[place].AT,PERRAL ?Supple Neck, No JVD,   ?Symmetrical Chest wall movement, Good air movement bilaterally, CTAB ?RRR,No Gallops, Rubs or new Murmurs,  ?+ve B.Sounds, Abd Soft, No tenderness,   ?No Cyanosis, Clubbing or edema  ? ? ?Data Reviewed: I have personally reviewed following labs and imaging studies ? ?CBC: ?Recent Labs  ?Lab 04/06/21 ?2221 04/07/21 ?[id number] 04/08/21 ?0243 04/09/21 ?[id number] 04/11/21 ?13080626  ?WBC 10.7* 7.8 6.2 7.4 7.2  ?NEUTROABS 8.1*  --   --   --   --   ?HGB 10.0* 9.8* 9.3* 10.5* 11.6*  ?HCT 29.0* 29.6* 27.4* 31.9* 34.3*  ?MCV 89.5 90.0 89.3 91.1 89.8  ?PLT 234 191 218 210 222  ? ?Basic Metabolic Panel: ?Recent Labs  ?Lab 04/06/21 ?2221 04/07/21 ?[id number] 04/08/21 ?0243 04/09/21 ?[id number] 04/11/21 ?[id number]  ?NA 131* 133* 141 136 138  ?K 5.6* 4.5 4.0 4.7 4.2  ?CL 92* 100 107 104 103  ?CO2 26 24 27 24 28   ?GLUCOSE 202* 170* 136* 160* 182*  ?BUN 58* 46* 20 9 8   ?CREATININE 1.55* 1.25* 0.85 0.83 0.84  ?CALCIUM 9.6 9.4 9.0 8.9 9.2  ?MG  --   --   --   --  1.6*  ? ?GFR: ?Estimated Creatinine Clearance: 58.5 mL/min (by C-G formula based on SCr of 0.84 mg/dL). ?Liver Function Tests: ?Recent Labs  ?Lab 04/06/21 ?2221  ?AST 26  ?ALT 16  ?ALKPHOS 53  ?BILITOT 0.5  ?PROT 7.0  ?ALBUMIN 3.5  ? ?Recent Labs  ?Lab 04/06/21 ?2221  ?LIPASE 33  ? ?CBG: ?Recent Labs  ?Lab 04/10/21 ?0740 04/10/21 ?1141 04/10/21 ?1646 04/10/21 ?2330 04/11/21 ?0742  ?GLUCAP 163* 323* 236* 172* 194*  ? ?Sepsis Labs: ?Recent Labs  ?Lab 04/06/21 ?2221 04/07/21 ?[id number] 04/07/21 ?[id number] 04/07/21 ?0710  ?PROCALCITON  --   --   --  <0.10  ?LATICACIDVEN 4.9* 3.9* 3.1* 1.1  ?  ? ?Scheduled Meds: ? atorvastatin  40 mg Oral Daily  ? doxycycline  100 mg Oral Q12H  ? enoxaparin  (LOVENOX) injection  40 mg Subcutaneous Q24H  ? insulin aspart  0-9 Units Subcutaneous TID AC & HS  ? mouth rinse  15 mL Mouth Rinse BID  ? pantoprazole  40 mg Oral BID AC  ? polyethylene glycol  17 g Oral BID  ? tamsulosin  0.4 mg Oral QPC breakfast  ? ?Continuous Infusions: ? sodium chloride Stopped (04/08/21 1239)  ? magnesium sulfate bolus IVPB    ? ? ? LOS: 4 days  ? ? ?Time spent: 25 minutes  ? ?Signature ? ?Susa Raring M.D on 04/11/2021 at 10:39 AM   -  To page go to [email or web address]  ? ?  ? ? ?

## 2021-04-11 NOTE — NC FL2 (Signed)
?[place] MEDICAID FL2 LEVEL OF CARE SCREENING TOOL  ?  ? ?IDENTIFICATION  ?Patient Name: ?Jermaine Nixon Birthdate: 1945-10-06 Sex: male Admission Date (Current Location): ?04/06/2021  ?[place] Number: ? [place] ?  Facility and Address:  ?[facility], [address] [place], [place] [address] ?     Provider Number: ?[id number]  ?Attending Physician Name and Address:  ?Leroy Sea, MD ? Relative Name and Phone Number:  ?Franky Macho (Sister)   [phone] ?   ?Current Level of Care: ?Hospital Recommended Level of Care: ?Skilled Nursing Facility Prior Approval Number: ?  ? ?Date Approved/Denied: ?  PASRR Number: ?[id number] A ? ?Discharge Plan: ?SNF ?  ? ?Current Diagnoses: ?Patient Active Problem List  ? Diagnosis Date Noted  ? Hyperkalemia 04/07/2021  ? Hypoxemia 04/07/2021  ? Type 2 diabetes mellitus without complications (HCC) 04/07/2021  ? Essential hypertension 04/07/2021  ? Dyslipidemia 04/07/2021  ? Anemia   ? Heme positive stool   ? Dehydration 02/09/2021  ? Lactic acidosis 02/09/2021  ? Hypernatremia 02/09/2021  ? AKI (acute kidney injury) (HCC) 02/09/2021  ? Hypercalcemia 02/09/2021  ? Generalized weakness 02/09/2021  ? Hypertension   ? HLD (hyperlipidemia)   ? Type 2 diabetes mellitus with chronic kidney disease, with long-term current use of insulin (HCC)   ? ? ?Orientation RESPIRATION BLADDER Height & Weight   ?  ?Self ?   Incontinent Weight: 125 lb 14.1 oz (57.1 kg) ?Height:  6' (182.9 cm)  ?BEHAVIORAL SYMPTOMS/MOOD NEUROLOGICAL BOWEL NUTRITION STATUS  ?    Incontinent    ?AMBULATORY STATUS COMMUNICATION OF NEEDS Skin   ?Extensive Assist Verbally Normal ?  ?  ?  ?    ?     ?     ? ? ?Personal Care Assistance Level of Assistance  ?Bathing, Feeding, Dressing Bathing Assistance: Maximum assistance ?Feeding assistance: Maximum assistance ?Dressing Assistance: Maximum assistance ?   ? ?Functional Limitations Info  ?Sight, Hearing, Speech Sight Info: Adequate ?Hearing  Info: Adequate ?Speech Info: Impaired  ? ? ?SPECIAL CARE FACTORS FREQUENCY  ?PT (By licensed PT), OT (By licensed OT)   ?  ?PT Frequency: 5x/ week ?OT Frequency: 5x/week ?  ?  ?  ?   ? ? ?Contractures Contractures Info: Not present  ? ? ?Additional Factors Info  ?Allergies, Code Status, Insulin Sliding Scale Code Status Info: full ?Allergies Info: nka ?  ?Insulin Sliding Scale Info: see discharge med list ?  ?   ? ?Current Medications (04/11/2021):  This is the current hospital active medication list ?Current Facility-Administered Medications  ?Medication Dose Route Frequency Provider Last Rate Last Admin  ? 0.9 %  sodium chloride infusion   Intravenous Continuous Mansy, Vernetta Honey, MD   Stopped at 04/08/21 1239  ? acetaminophen (TYLENOL) tablet 650 mg  650 mg Oral Q6H PRN Mansy, Jan A, MD      ? Or  ? acetaminophen (TYLENOL) suppository 650 mg  650 mg Rectal Q6H PRN Mansy, Jan A, MD      ? atorvastatin (LIPITOR) tablet 40 mg  40 mg Oral Daily Mansy, Jan A, MD   40 mg at 04/11/21 7412  ? docusate sodium (COLACE) capsule 100 mg  100 mg Oral Daily Leroy Sea, MD   100 mg at 04/11/21 1159  ? doxycycline (VIBRA-TABS) tablet 100 mg  100 mg Oral Q12H Sharlene Dory, DO   100 mg at 04/11/21 8786  ? enoxaparin (LOVENOX) injection 40 mg  40 mg Subcutaneous  Q24H Hyacinth Meeker [place], [place]   40 mg at 04/11/21 6761  ? Gerhardt's butt cream   Topical TID PRN Cathren Harsh, MD   Given at 04/09/21 0036  ? insulin aspart (novoLOG) injection 0-9 Units  0-9 Units Subcutaneous TID [facility] Mansy, Vernetta Honey, MD   9 Units at 04/11/21 1200  ? MEDLINE mouth rinse  15 mL Mouth Rinse BID Rai, Ripudeep K, MD   15 mL at 04/11/21 0827  ? ondansetron (ZOFRAN) tablet 4 mg  4 mg Oral Q6H PRN Mansy, Jan A, MD      ? Or  ? ondansetron [facility]) injection 4 mg  4 mg Intravenous Q6H PRN Mansy, Jan A, MD      ? pantoprazole (PROTONIX) EC tablet 40 mg  40 mg Oral BID AC [place], [place]   40 mg at 04/11/21 9509  ? [START ON 04/12/2021]  polyethylene glycol (MIRALAX / GLYCOLAX) packet 17 g  17 g Oral Daily Leroy Sea, MD      ? tamsulosin [facility]) capsule 0.4 mg  0.4 mg Oral QPC breakfast Mansy, Jan A, MD   0.4 mg at 04/11/21 3267  ? traZODone (DESYREL) tablet 25 mg  25 mg Oral QHS PRN Mansy, Vernetta Honey, MD      ? ? ? ?Discharge Medications: ?Please see discharge summary for a list of discharge medications. ? ?Relevant Imaging Results: ? ?Relevant Lab Results: ? ? ?Additional Information ?SSN:  [id number];Pfizer COVID-19 Vaccine 10/21/2019 ; 6'  125lbs ? ?Catalina Pizza Chasitty Hehl, LCSWA ? ? ? ? ?

## 2021-04-12 LAB — COMPREHENSIVE METABOLIC PANEL
ALT: 17 U/L (ref 0–44)
AST: 22 U/L (ref 15–41)
Albumin: 2.9 g/dL — ABNORMAL LOW (ref 3.5–5.0)
Alkaline Phosphatase: 53 U/L (ref 38–126)
Anion gap: 6 (ref 5–15)
BUN: 10 mg/dL (ref 8–23)
CO2: 27 mmol/L (ref 22–32)
Calcium: 9.3 mg/dL (ref 8.9–10.3)
Chloride: 104 mmol/L (ref 98–111)
Creatinine, Ser: 0.77 mg/dL (ref 0.61–1.24)
GFR, Estimated: >60 mL/min (ref >60)
Glucose, Bld: 113 mg/dL — ABNORMAL HIGH (ref 70–99)
Potassium: 4.2 mmol/L (ref 3.5–5.1)
Sodium: 137 mmol/L (ref 135–145)
Total Bilirubin: 0.4 mg/dL (ref 0.3–1.2)
Total Protein: 6.2 g/dL — ABNORMAL LOW (ref 6.5–8.1)

## 2021-04-12 LAB — MAGNESIUM: Magnesium: 1.9 mg/dL (ref 1.7–2.4)

## 2021-04-12 LAB — CULTURE, BLOOD (ROUTINE X 2)
Culture: NO GROWTH
Culture: NO GROWTH
Special Requests: ADEQUATE

## 2021-04-12 LAB — CBC WITH DIFFERENTIAL/PLATELET
Abs Immature Granulocytes: 0.06 10*3/uL (ref 0.00–0.07)
Basophils Absolute: 0 10*3/uL (ref 0.0–0.1)
Basophils Relative: 0 %
Eosinophils Absolute: 0.1 10*3/uL (ref 0.0–0.5)
Eosinophils Relative: 2 %
HCT: 32.2 % — ABNORMAL LOW (ref 39.0–52.0)
Hemoglobin: 11 g/dL — ABNORMAL LOW (ref 13.0–17.0)
Immature Granulocytes: 1 %
Lymphocytes Relative: 22 %
Lymphs Abs: 1.7 10*3/uL (ref 0.7–4.0)
MCH: 30.6 pg (ref 26.0–34.0)
MCHC: 34.2 g/dL (ref 30.0–36.0)
MCV: 89.4 fL (ref 80.0–100.0)
Monocytes Absolute: 0.6 10*3/uL (ref 0.1–1.0)
Monocytes Relative: 8 %
Neutro Abs: 5.2 10*3/uL (ref 1.7–7.7)
Neutrophils Relative %: 67 %
Platelets: 245 10*3/uL (ref 150–400)
RBC: 3.6 MIL/uL — ABNORMAL LOW (ref 4.22–5.81)
RDW: 15.4 % (ref 11.5–15.5)
WBC: 7.7 10*3/uL (ref 4.0–10.5)
nRBC: 0 % (ref 0.0–0.2)

## 2021-04-12 LAB — GLUCOSE, CAPILLARY
Glucose-Capillary: 169 mg/dL — ABNORMAL HIGH (ref 70–99)
Glucose-Capillary: 180 mg/dL — ABNORMAL HIGH (ref 70–99)
Glucose-Capillary: 215 mg/dL — ABNORMAL HIGH (ref 70–99)
Glucose-Capillary: 285 mg/dL — ABNORMAL HIGH (ref 70–99)
Glucose-Capillary: 176 mg/dL — ABNORMAL HIGH (ref 70–99)

## 2021-04-12 LAB — BRAIN NATRIURETIC PEPTIDE: B Natriuretic Peptide: 328 pg/mL — ABNORMAL HIGH (ref 0.0–100.0)

## 2021-04-12 MED ORDER — AMLODIPINE BESYLATE 10 MG PO TABS
10.0000 mg | ORAL_TABLET | Freq: Every day | ORAL | Status: DC
  Administered 2021-04-12 – 2021-04-13 (×2): 10 mg via ORAL
  Filled 2021-04-12 (×2): qty 1

## 2021-04-12 MED ORDER — POLYETHYLENE GLYCOL 3350 17 G PO PACK: 17.0000 g | PACK | Freq: Every day | ORAL | 0 refills | Status: AC | PRN

## 2021-04-12 MED ORDER — DOCUSATE SODIUM 100 MG PO CAPS: 100.0000 mg | ORAL_CAPSULE | Freq: Every day | ORAL | 0 refills | Status: AC

## 2021-04-12 MED ORDER — AMLODIPINE BESYLATE 10 MG PO TABS: 10.0000 mg | ORAL_TABLET | Freq: Every day | ORAL | Status: AC

## 2021-04-12 MED ORDER — INSULIN LISPRO (1 UNIT DIAL) 100 UNIT/ML (KWIKPEN): PEN_INJECTOR | SUBCUTANEOUS | 0 refills | Status: AC

## 2021-04-12 MED ORDER — INSULIN DETEMIR 100 UNIT/ML ~~LOC~~ SOLN: 12.0000 [IU] | Freq: Every day | SUBCUTANEOUS | 0 refills | Status: AC

## 2021-04-12 MED ORDER — PANTOPRAZOLE SODIUM 40 MG PO TBEC: 40.0000 mg | DELAYED_RELEASE_TABLET | Freq: Two times a day (BID) | ORAL | Status: AC

## 2021-04-12 NOTE — TOC Progression Note (Addendum)
Transition of Care (TOC) - Initial/Assessment Note  ? ? ?Patient Details  ?Name: Jermaine Nixon ?MRN: [id number] ?Date of Birth: March 03, 1945 ? ?Transition of Care (TOC) CM/SW Contact:    ?Paulene Floor Rosalva Neary, LCSWA ?Phone Number: ?04/12/2021, 8:24 AM ? ?Clinical Narrative:                 ?08:24-  CSW contacted the patient's sister/ caregiver to present bed offers.  There was no answer.  CSW left a VM requesting a returned call. ? ?14:30- CSW called the patient's sister/ caregiver again and was informed that the family woud like for the patient to go to [facility]. ? ?CSW contacted [place] with admissions at [facility].  The facility can accept the patient tomorrow. ? ?Insurance Josem Kaufmann has been approved 4/11 - 4/13, next review 4/13.  Reference ID: [id number].   ? ?Expected Discharge Plan: [place] ?Barriers to Discharge: Continued Medical Work up ? ? ?Patient Goals and CMS Choice ?Patient states their goals for this hospitalization and ongoing recovery are:: return home ?CMS Medicare.gov Compare Post Acute Care list provided to:: Patient ?Choice offered to / list presented to : Patient ? ?Expected Discharge Plan and Services ?Expected Discharge Plan: [place] ?  ?Discharge Planning Services: CM Consult ?Post Acute Care Choice: Home Health ?Living arrangements for the past 2 months: [place] ?Expected Discharge Date: 04/09/21               ?  ?  ?  ?  ?  ?HH Arranged: PT, OT ?[place] Agency: [place] ?Date HH Agency Contacted: 04/10/21 ?Time [place]: [phone] spoke with at [place]: Tommi Rumps ? ?Prior Living Arrangements/Services ?Living arrangements for the past 2 months: [place] ?Lives with:: Self ?Patient language and need for interpreter reviewed:: Yes ?       ?Need for Family Participation in Patient Care: Yes (Comment) ?Care giver support system in place?: Yes (comment) ?Current home services: DME ?Criminal  Activity/Legal Involvement Pertinent to Current Situation/Hospitalization: No - Comment as needed ? ?Activities of Daily Living ?Home Assistive Devices/Equipment: Blood pressure cuff, CBG Meter, Scales, Walker (specify type), Wheelchair, Grab bars around toilet, Grab bars in shower, Hand-held shower hose ?ADL Screening (condition at time of admission) ?Patient's cognitive ability adequate to safely complete daily activities?: No ?Is the patient deaf or have difficulty hearing?: No ?Does the patient have difficulty seeing, even when wearing glasses/contacts?: No ?Does the patient have difficulty concentrating, remembering, or making decisions?: Yes ?Patient able to express need for assistance with ADLs?: No ?Does the patient have difficulty dressing or bathing?: Yes ?Independently performs ADLs?: No ?Communication: Dependent ?Is this a change from baseline?: Pre-admission baseline ?Dressing (OT): Dependent ?Is this a change from baseline?: Pre-admission baseline ?Grooming: Dependent ?Is this a change from baseline?: Pre-admission baseline ?Feeding: Dependent ?Is this a change from baseline?: Pre-admission baseline ?Bathing: Dependent ?Is this a change from baseline?: Pre-admission baseline ?Toileting: Dependent ?Is this a change from baseline?: Pre-admission baseline ?In/Out Bed: Dependent ?Is this a change from baseline?: Pre-admission baseline ?Walks in Home: Dependent ?Is this a change from baseline?: Pre-admission baseline ?Does the patient have difficulty walking or climbing stairs?: Yes ?Weakness of Legs: Both ?Weakness of Arms/Hands: Both ? ?Permission Sought/Granted ?  ?  ?   ?   ?   ?   ? ?Emotional Assessment ?  ?  ?  ?  ?Alcohol / Substance Use: Not  Applicable ?Psych Involvement: No (comment) ? ?Admission diagnosis:  Lactic acidosis [E87.20] ?Weakness [R53.1] ?Patient Active Problem List  ? Diagnosis Date Noted  ? Hyperkalemia 04/07/2021  ? Hypoxemia 04/07/2021  ? Type 2 diabetes mellitus without  complications ([place]) 123XX123  ? Essential hypertension 04/07/2021  ? Dyslipidemia 04/07/2021  ? Anemia   ? Heme positive stool   ? Dehydration 02/09/2021  ? Lactic acidosis 02/09/2021  ? Hypernatremia 02/09/2021  ? AKI (acute kidney injury) ([place]) 02/09/2021  ? Hypercalcemia 02/09/2021  ? Generalized weakness 02/09/2021  ? Hypertension   ? HLD (hyperlipidemia)   ? Type 2 diabetes mellitus with chronic kidney disease, with long-term current use of insulin ([place])   ? ?PCP:  Saintclair Halsted, FNP ?Pharmacy:   ?[facility] [place], [address] ?[place] ?[place] [address] ?Phone: [phone] Fax: [phone] ? ? ? ? ?Social Determinants of Health (SDOH) Interventions ?  ? ?Readmission Risk Interventions ?   ? View : No data to display.  ?  ?  ?  ? ? ? ?

## 2021-04-12 NOTE — Discharge Summary (Addendum)
?                                                                                ? Verne Lanuza [id number]:[id number] DOB: 1945/08/17 DOA: 04/06/2021 ? ?PCP: Saintclair Halsted, FNP ? ?Admit date: 04/06/2021  Discharge date: 04/13/2021 ? ?Admitted From: Home   Disposition:  SNF ? ? ?Recommendations for Outpatient Follow-up:  ? ?Follow up with PCP in 1-2 weeks ? ?PCP Please obtain BMP/CBC, 2 view CXR in 1week,  (see Discharge instructions)  ? ?PCP Please follow up on the following pending results:  ? ? ?Home Health: None   ?Equipment/Devices: None  ?Consultations: None  ?Discharge Condition: Stable    ?CODE STATUS: Full    ?Diet Recommendation: Heart Healthy Low Carb, check CBGs q. ACH S. ?  ? ?Chief Complaint  ?Patient presents with  ? Weakness  ?  ? ?Brief history of present illness from the day of admission and additional interim summary   ? ?Patient is a 76 year old male with diabetes mellitus type 2, mental disability, BPH, hypertension presented to ED with acute onset of generalized weakness, hypotension and shaking while he was eating.  Patient is fairly poor historian due to mental disability.  He was found to be severely dehydrated with AKI, hyponatremia and lactic acidosis due to hypoperfusion ? ?                                                               Hospital Course  ? ? Severe dehydration causing AKI, hyper kalemia and lactic acidosis.  All resolved after IV fluid administration and supportive care, no signs of sepsis.  Completed 5 days of doxycycline per previous MD. will be discharged to SNF, will discontinue home diuretic upon discharge. ?   ?  ?Deconditioning -  PT, will need SNF. ?             ?Constipation -  Encourage PO hydration, continue bowel regimen. ?     ? Heme positive stool - EGD unremarkable other than foreign body, anoscopy could not be done due to stool burden.  Outpatient GI follow-up with [facility] in 1  to 2 weeks. ?             ?Hypoxemia - Resolved ?  ?Essential hypertension -blood pressure medications adjusted to Norvasc. ?              ?Dyslipidemia - Lipitor 40 mg daily ?  ?Anemia - Chronic anemia.  No acute drop.  H&H actually rising. ?  ?DM2 -oral intake is questionable hence placed on Lantus along with sliding scale, continue home oral medications except for Actos. ? ? ?Discharge diagnosis   ? ? ?Principal Problem: ?  Lactic acidosis ?Active Problems: ?  AKI (acute kidney injury) ([place]) ?  Dehydration ?  Hyperkalemia ?  Hypoxemia ?  Type 2 diabetes mellitus without complications ([place]) ?  Essential hypertension ?  Dyslipidemia ?  Anemia ?  Heme positive stool ? ? ? ?  Discharge instructions   ? ?Discharge Instructions   ? ? Discharge instructions   Complete by: As directed ?  ? Follow with Primary MD Saintclair Halsted, FNP in 7 days  ? ?Get CBC, CMP, 2 view Chest X ray -  checked next visit within 1 week by  SNF MD  ? ?Activity: As tolerated with Full fall precautions use walker/cane & assistance as needed ? ?Disposition SNF ? ?Diet: Heart Healthy Low Carb with feeding assistance and aspiration precautions.  CBGs q. [place]. ?  ? ?Special Instructions: If you have smoked or chewed Tobacco  in the last 2 yrs please stop smoking, stop any regular Alcohol  and or any Recreational drug use. ? ?On your next visit with your primary care physician please Get Medicines reviewed and adjusted. ? ?Please request your Prim.MD to go over all Hospital Tests and Procedure/Radiological results at the follow up, please get all Hospital records sent to your Prim MD by signing hospital release before you go home. ? ?If you experience worsening of your admission symptoms, develop shortness of breath, life threatening emergency, suicidal or homicidal thoughts you must seek medical attention immediately by calling 911 or calling your MD immediately  if symptoms less severe. ? ?You Must read complete instructions/literature along with  all the possible adverse reactions/side effects for all the Medicines you take and that have been prescribed to you. Take any new Medicines after you have completely understood and accpet all the possible adverse reactions/side effects.  ? Increase activity slowly   Complete by: As directed ?  ? Increase activity slowly   Complete by: As directed ?  ? ?  ? ? ?Discharge Medications  ? ?Allergies as of 04/13/2021   ?No Known Allergies ?  ? ?  ?Medication List  ?  ? ?STOP taking these medications   ? ?benazepril 20 MG tablet ?Commonly known as: LOTENSIN ?  ?hydrochlorothiazide 25 MG tablet ?Commonly known as: HYDRODIURIL ?  ?pioglitazone 45 MG tablet ?Commonly known as: ACTOS ?  ? ?  ? ?TAKE these medications   ? ?amLODipine 10 MG tablet ?Commonly known as: NORVASC ?Take 1 tablet (10 mg total) by mouth daily. ?  ?aspirin EC 81 MG tablet ?Take 81 mg by mouth daily. ?  ?Assure Comfort Lancets 28G Misc ?Frequency:   Dosage:0     Instructions:  Note:check glucose daily ?  ?atorvastatin 40 MG tablet ?Commonly known as: LIPITOR ?Take 40 mg by mouth daily. ?  ?docusate sodium 100 MG capsule ?Commonly known as: COLACE ?Take 1 capsule (100 mg total) by mouth daily. ?  ?Fifty50 Glucose Meter 2.0 w/Device Kit ?Frequency:   Dosage:0     Instructions:  Note:check blood sugar daily ?  ?insulin detemir 100 UNIT/ML injection ?Commonly known as: Levemir ?Inject 0.12 mLs (12 Units total) into the skin daily. ?  ?insulin lispro 100 UNIT/ML KwikPen ?Commonly known as: HumaLOG KwikPen ?Before each meal 3 times a day, 140-199 - 2 units, 200-250 - 4 units, 251-299 - 6 units,  300-349 - 8 units,  350 or above 10 units. ?  ?Jardiance 10 MG Tabs tablet ?Generic drug: empagliflozin ?Take 1 tablet (10 mg total) by mouth daily. Hold for now, please follow up with your pcp to discuss resumption  this medication ?What changed: additional instructions ?  ?metFORMIN 500 MG tablet ?Commonly known as: GLUCOPHAGE ?Take 500 mg by mouth 2 (two) times daily  with a meal. ?  ?pantoprazole 40 MG tablet ?Commonly known as:  PROTONIX ?Take 1 tablet (40 mg total) by mouth 2 (two) times daily before a meal. ?  ?polyethylene glycol 17 g packet ?Commonly known as: MIRALAX / GLYCOLAX ?Take 17 g by mouth daily as needed. ?  ?Prodigy No Coding Blood Gluc test strip ?Generic drug: glucose blood ?Frequency:Daily   Dosage:1     Instructions:  Note:TEST ONCE DAILY. ?  ?tamsulosin 0.4 MG Caps capsule ?Commonly known as: FLOMAX ?Take 0.4 mg by mouth daily after breakfast. ?  ? ?  ? ? ? Follow-up Information   ? ? Care, [facility] Follow up.   ?Specialty: Home Health Services ?Why: HHPT/OT arranged- they will contact you to schedule initial visit ?Contact information: ?[address] ?[address] ?[place] [address] ?[phone] ? ? ?  ?  ? ? [facility]. Schedule an appointment as soon as possible for a visit in 1 week(s).   ?Specialty: Gastroenterology ?Contact information: ?[address] ?[place] [address] ?[phone] ? ?  ?  ? ?  ?  ? ?  ? ? ?Major procedures and Radiology Reports - PLEASE review detailed and final reports thoroughly  -    ? ?  ?CT Head Wo Contrast ? ?Result Date: 04/06/2021 ?CLINICAL DATA:  Mental status change, unknown cause EXAM: CT HEAD WITHOUT CONTRAST TECHNIQUE: Contiguous axial images were obtained from the base of the skull through the vertex without intravenous contrast. RADIATION DOSE REDUCTION: This exam was performed according to the departmental dose-optimization program which includes automated exposure control, adjustment of the mA and/or kV according to patient size and/or use of iterative reconstruction technique. 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 No acute intracranial abnormality. Specifically, no hemorrhage, hydrocephalus, mass lesion, acute infarction, or  significant intracranial injury. Vascular: No hyperdense vessel or unexpected calcification. Skull: No acute calvarial abnormality. Sinuses/Orbits: No acute findings Other: None IMPRESSION: Atrophy, chro

## 2021-04-12 NOTE — Progress Notes (Signed)
Physical Therapy Treatment ?Patient Details ?Name: Jermaine Nixon ?MRN: [id number] ?DOB: 1945/05/29 ?Today's Date: 04/12/2021 ? ? ?History of Present Illness 76 y/o male presented to ED on 04/06/21 for weakness, hypotension, and shaking. Admitted for AKI with lactic acidosis and dehydration. CXR negative. S/p EGD with foreign body removal on 4/7. PMH: T2DM, mental disability, BPH, HTN ? ?  ?PT Comments  ? ? Continuing work on functional mobility and activity tolerance;  PT arrived as pt was eating lunch, but pt agreeable to getting up OOB to chair; continues to have a strong posterior bias with standing and stepping; Needs up to mod assist for sit to stand, and +2 Mod/max assist for step-pivot transfers OOB to recliner; Better participation today  ?Recommendations for follow up therapy are one component of a multi-disciplinary discharge planning process, led by the attending physician.  Recommendations may be updated based on patient status, additional functional criteria and insurance authorization. ? ?Follow Up Recommendations ? Skilled nursing-short term rehab (<3 hours/day) ?  ?  ?Assistance Recommended at Discharge Frequent or constant Supervision/Assistance  ?Patient can return home with the following Two people to help with walking and/or transfers;Two people to help with bathing/dressing/bathroom ?  ?Equipment Recommendations ? Rolling walker (2 wheels);BSC/3in1;Wheelchair (measurements PT);Wheelchair cushion (measurements PT)  ?  ?Recommendations for Other Services   ? ? ?  ?Precautions / Restrictions Precautions ?Precautions: Fall ?Precaution Comments: cognitive disability  ?  ? ?Mobility ? Bed Mobility ?Overal bed mobility: Needs Assistance ?Bed Mobility: Supine to Sit ?  ?  ?Supine to sit: Min assist, Mod assist ?  ?  ?General bed mobility comments: Smooth motion in bringing his own trunk up to sit, needed mod assist and multimodal cueing of weight shifts to reciprocally scoot hips to EOB in prep for standing ?   ? ?Transfers ?Overall transfer level: Needs assistance ?Equipment used: Rolling walker (2 wheels) ?Transfers: Sit to/from Stand, Bed to chair/wheelchair/BSC ?Sit to Stand: Mod assist ?  ?Step pivot transfers: +2 physical assistance, Max assist ?  ?  ?  ?General transfer comment: Persistent heavy posterior bias, needing heavy mod assist and at times Max assist to keep balance, espeically with steps t recliner ?  ? ?Ambulation/Gait ?  ?  ?  ?  ?  ?  ?  ?  ? ? ?Stairs ?  ?  ?  ?  ?  ? ? ?Wheelchair Mobility ?  ? ?Modified Rankin (Stroke Patients Only) ?  ? ? ?  ?Balance   ?  ?Sitting balance-Leahy Scale: Fair ?Sitting balance - Comments: able to sit on EOB with min guard ?  ?Standing balance support: Bilateral upper extremity supported ?Standing balance-Leahy Scale: Zero ?Standing balance comment: reliant on RW for balance; heavy posterior lean today, unale to stay standing without heavy mod/max assist ?  ?  ?  ?  ?  ?  ?  ?  ?  ?  ?  ?  ? ?  ?Cognition Arousal/Alertness: Awake/alert ?Behavior During Therapy: [facility] for tasks assessed/performed ?Overall Cognitive Status: History of cognitive impairments - at baseline ?  ?  ?  ?  ?  ?  ?  ?  ?  ?  ?  ?  ?  ?  ?  ?  ?  ?  ?  ? ?  ?Exercises   ? ?  ?General Comments General comments (skin integrity, edema, etc.): Noted pt was able to feed himself lunch once up in the recliner ?  ?  ? ?Pertinent  Vitals/Pain Pain Assessment ?Pain Assessment: No/denies pain  ? ? ?Home Living   ?  ?  ?  ?  ?  ?  ?  ?  ?  ?   ?  ?Prior Function    ?  ?  ?   ? ?PT Goals (current goals can now be found in the care plan section) Acute Rehab PT Goals ?Patient Stated Goal: agreed to get up ?PT Goal Formulation: Patient unable to participate in goal setting ?Time For Goal Achievement: 04/23/21 ?Potential to Achieve Goals: Fair ?Progress towards PT goals: Progressing toward goals (slowly) ? ?  ?Frequency ? ? ? Min 3X/week ? ? ? ?  ?PT Plan Current plan remains appropriate  ? ? ?Co-evaluation   ?  ?  ?   ?  ? ?  ?AM-PAC PT "6 Clicks" Mobility   ?Outcome Measure ? Help needed turning from your back to your side while in a flat bed without using bedrails?: A Little ?Help needed moving from lying on your back to sitting on the side of a flat bed without using bedrails?: A Little ?Help needed moving to and from a bed to a chair (including a wheelchair)?: A Lot ?Help needed standing up from a chair using your arms (e.g., wheelchair or bedside chair)?: Total ?Help needed to walk in hospital room?: Total ?Help needed climbing 3-5 steps with a railing? : Total ?6 Click Score: 11 ? ?  ?End of Session Equipment Utilized During Treatment: Gait belt ?Activity Tolerance: Patient tolerated treatment well ?Patient left: in chair;with call bell/phone within reach;with chair alarm set ?Nurse Communication: Mobility status ?PT Visit Diagnosis: Muscle weakness (generalized) (M62.81) ?  ? ? ?Time: 1211-1224 ?PT Time Calculation (min) (ACUTE ONLY): 13 min ? ?Charges:  $Therapeutic Activity: 8-22 mins          ?          ? ?Van Clines, PT  ?Acute Rehabilitation Services ?Office [phone] ? ? ? ?Jermaine Nixon ?04/12/2021, 2:20 PM ? ?

## 2021-04-13 LAB — COMPREHENSIVE METABOLIC PANEL
ALT: 23 U/L (ref 0–44)
AST: 29 U/L (ref 15–41)
Albumin: 2.9 g/dL — ABNORMAL LOW (ref 3.5–5.0)
Alkaline Phosphatase: 50 U/L (ref 38–126)
Anion gap: 6 (ref 5–15)
BUN: 14 mg/dL (ref 8–23)
CO2: 25 mmol/L (ref 22–32)
Calcium: 9.3 mg/dL (ref 8.9–10.3)
Chloride: 105 mmol/L (ref 98–111)
Creatinine, Ser: 0.86 mg/dL (ref 0.61–1.24)
GFR, Estimated: >60 mL/min (ref >60)
Glucose, Bld: 125 mg/dL — ABNORMAL HIGH (ref 70–99)
Potassium: 4.3 mmol/L (ref 3.5–5.1)
Sodium: 136 mmol/L (ref 135–145)
Total Bilirubin: 0.5 mg/dL (ref 0.3–1.2)
Total Protein: 5.9 g/dL — ABNORMAL LOW (ref 6.5–8.1)

## 2021-04-13 LAB — CBC WITH DIFFERENTIAL/PLATELET
Abs Immature Granulocytes: 0.04 10*3/uL (ref 0.00–0.07)
Basophils Absolute: 0 10*3/uL (ref 0.0–0.1)
Basophils Relative: 0 %
Eosinophils Absolute: 0.1 10*3/uL (ref 0.0–0.5)
Eosinophils Relative: 2 %
HCT: 30.3 % — ABNORMAL LOW (ref 39.0–52.0)
Hemoglobin: 10.3 g/dL — ABNORMAL LOW (ref 13.0–17.0)
Immature Granulocytes: 1 %
Lymphocytes Relative: 26 %
Lymphs Abs: 1.7 10*3/uL (ref 0.7–4.0)
MCH: 30.4 pg (ref 26.0–34.0)
MCHC: 34 g/dL (ref 30.0–36.0)
MCV: 89.4 fL (ref 80.0–100.0)
Monocytes Absolute: 0.5 10*3/uL (ref 0.1–1.0)
Monocytes Relative: 7 %
Neutro Abs: 4.1 10*3/uL (ref 1.7–7.7)
Neutrophils Relative %: 64 %
Platelets: 231 10*3/uL (ref 150–400)
RBC: 3.39 MIL/uL — ABNORMAL LOW (ref 4.22–5.81)
RDW: 15.8 % — ABNORMAL HIGH (ref 11.5–15.5)
WBC: 6.5 10*3/uL (ref 4.0–10.5)
nRBC: 0 % (ref 0.0–0.2)

## 2021-04-13 LAB — GLUCOSE, CAPILLARY
Glucose-Capillary: 202 mg/dL — ABNORMAL HIGH (ref 70–99)
Glucose-Capillary: 208 mg/dL — ABNORMAL HIGH (ref 70–99)

## 2021-04-13 LAB — BRAIN NATRIURETIC PEPTIDE: B Natriuretic Peptide: 253 pg/mL — ABNORMAL HIGH (ref 0.0–100.0)

## 2021-04-13 LAB — MAGNESIUM: Magnesium: 1.8 mg/dL (ref 1.7–2.4)

## 2021-04-13 NOTE — TOC Transition Note (Signed)
Transition of Care (TOC) - CM/SW Discharge Note ? ? ?Patient Details  ?Name: Jermaine Nixon ?MRN: [id number] ?Date of Birth: [birth year] ? ?Transition of Care (TOC) CM/SW Contact:  ?Paulene Floor Wrangler Penning, LCSWA ?Phone Number: ?04/13/2021, 11:22 AM ? ? ?Clinical Narrative:    ?Patient will DC to: [facility]  ?Anticipated DC date:  04/13/2021 ?Family notified: Yes ?Transport by:  Corey Harold ? ? ?Per MD patient ready for DC to SNF. RN to call report prior to discharge [phone] room103. RN, patient, patient's family, and facility notified of DC. Discharge Summary and FL2 sent to facility. DC packet on chart. Ambulance transport will be requested for patient.  ? ?CSW will sign off for now as social work intervention is no longer needed. Please consult [place] again if new needs arise. ?  ? ? ?Final next level of care: [place] ?Barriers to Discharge: Barriers Resolved ? ? ?Patient Goals and CMS Choice ?Patient states their goals for this hospitalization and ongoing recovery are:: return home ?CMS Medicare.gov Compare Post Acute Care list provided to:: Patient Represenative (must comment) ?Choice offered to / list presented to : Sibling ? ?Discharge Placement ?  ?           ?Patient chooses bed at: [facility] ?Patient to be transferred to facility by: [facility] ?Name of family member notified: Brock Bad (Sister)   [phone] ?Patient and family notified of of transfer: 04/13/21 ? ?Discharge Plan and Services ?  ?Discharge Planning Services: CM Consult ?Post Acute Care Choice: Home Health          ?  ?  ?  ?  ?  ?HH Arranged: PT, OT ?[place] Agency: [place] ?Date HH Agency Contacted: 04/10/21 ?Time [place]: [id number] spoke with at [place]: Tommi Rumps ? ?Social Determinants of Health (SDOH) Interventions ?  ? ? ?Readmission Risk Interventions ?   ? View : No data to display.  ?  ?  ?  ? ? ? ? ? ?

## 2021-04-13 NOTE — Progress Notes (Signed)
Called report to [employer] at this time.  ?

## 2021-04-13 NOTE — Progress Notes (Signed)
Occupational Therapy Treatment ?Patient Details ?Name: Jermaine Nixon ?MRN: [id number] ?DOB: September 14, 1945 ?Today's Date: 04/13/2021 ? ? ?History of present illness 76 y/o male presented to ED on 04/06/21 for weakness, hypotension, and shaking. Admitted for AKI with lactic acidosis and dehydration. CXR negative. S/p EGD with foreign body removal on 4/7. PMH: T2DM, mental disability, BPH, HTN ?  ?OT comments ? Patient was min to mod assist to get to EOB. Patient noticed his applesauce and indicated he wanted to finish it. Patient was able to feed self while seated on EOB.  Patient transferred to recliner with RW and mod assist and performed grooming tasks seated. Patient stood to perform peri area cleaning and returned to bed. Patient is expected to discharge to SNF for continued rehab.   ? ?Recommendations for follow up therapy are one component of a multi-disciplinary discharge planning process, led by the attending physician.  Recommendations may be updated based on patient status, additional functional criteria and insurance authorization. ?   ?Follow Up Recommendations ? Skilled nursing-short term rehab (<3 hours/day)  ?  ?Assistance Recommended at Discharge Frequent or constant Supervision/Assistance  ?Patient can return home with the following ? A lot of help with walking and/or transfers;A lot of help with bathing/dressing/bathroom;Assistance with feeding;Assistance with cooking/housework;Direct supervision/assist for medications management;Assist for transportation;Help with stairs or ramp for entrance;Direct supervision/assist for financial management ?  ?Equipment Recommendations ? BSC/3in1  ?  ?Recommendations for Other Services   ? ?  ?Precautions / Restrictions Precautions ?Precautions: Fall ?Precaution Comments: cognitive disability ?Restrictions ?Weight Bearing Restrictions: No  ? ? ?  ? ?Mobility Bed Mobility ?Overal bed mobility: Needs Assistance ?Bed Mobility: Supine to Sit, Sit to Supine ?  ?  ?Supine to  sit: Min assist, Mod assist ?Sit to supine: Min assist ?  ?General bed mobility comments: required assistance with BLE to initiate ?  ? ?Transfers ?Overall transfer level: Needs assistance ?Equipment used: Rolling walker (2 wheels) ?Transfers: Sit to/from Stand, Bed to chair/wheelchair/BSC ?Sit to Stand: Mod assist ?  ?  ?Step pivot transfers: Mod assist ?  ?  ?General transfer comment: posterior leaning during transfer ?  ?  ?Balance Overall balance assessment: Needs assistance ?Sitting-balance support: Feet supported ?Sitting balance-Leahy Scale: Fair ?Sitting balance - Comments: able to feed self sitting on EOB ?Postural control: Posterior lean ?Standing balance support: Bilateral upper extremity supported ?Standing balance-Leahy Scale: Zero ?Standing balance comment: stood for peri area cleaning with RW for support ?  ?  ?  ?  ?  ?  ?  ?  ?  ?  ?  ?   ? ?ADL either performed or assessed with clinical judgement  ? ?ADL Overall ADL's : Needs assistance/impaired ?Eating/Feeding: Supervision/ safety;Sitting ?Eating/Feeding Details (indicate cue type and reason): ate apple sauce sitting on EOB ?Grooming: Wash/dry hands;Wash/dry face;Oral care;Supervision/safety;Sitting ?Grooming Details (indicate cue type and reason): verbal cues for sequencing while seated in recliner ?  ?  ?Lower Body Bathing: Maximal assistance;Sit to/from stand ?Lower Body Bathing Details (indicate cue type and reason): stood at recliner to clean peri area ?  ?  ?  ?  ?Toilet Transfer: Moderate assistance;Rolling walker (2 wheels) ?Toilet Transfer Details (indicate cue type and reason): simulated to recliner ?  ?  ?  ?  ?  ?General ADL Comments: improvement with performing grooming tasks and self feeding ?  ? ?Extremity/Trunk Assessment   ?  ?  ?  ?  ?  ? ?Vision   ?  ?  ?Perception   ?  ?  Praxis   ?  ? ?Cognition Arousal/Alertness: Awake/alert ?Behavior During Therapy: [facility] for tasks assessed/performed ?Overall Cognitive Status: History of  cognitive impairments - at baseline ?  ?  ?  ?  ?  ?  ?  ?  ?  ?  ?  ?  ?  ?  ?  ?  ?General Comments: expressed nees with nodding and pointing ?  ?  ?   ?Exercises   ? ?  ?Shoulder Instructions   ? ? ?  ?General Comments    ? ? ?Pertinent Vitals/ Pain       Pain Assessment ?Pain Assessment: No/denies pain ? ?Home Living   ?  ?  ?  ?  ?  ?  ?  ?  ?  ?  ?  ?  ?  ?  ?  ?  ?  ?  ? ?  ?Prior Functioning/Environment    ?  ?  ?  ?   ? ?Frequency ? Min 2X/week  ? ? ? ? ?  ?Progress Toward Goals ? ?OT Goals(current goals can now be found in the care plan section) ? Progress towards OT goals: Progressing toward goals ? ?Acute Rehab OT Goals ?OT Goal Formulation: Patient unable to participate in goal setting ?Time For Goal Achievement: 04/23/21 ?Potential to Achieve Goals: Fair ?ADL Goals ?Pt Will Perform Upper Body Dressing: with mod assist;sitting;standing;bed level ?Pt Will Perform Lower Body Dressing: with mod assist;sitting/lateral leans;sit to/from stand ?Pt Will Transfer to Toilet: with mod assist;ambulating;bedside commode;stand pivot transfer ?Pt Will Perform Tub/Shower Transfer: with mod assist;with caregiver independent in assisting;shower seat;ambulating;rolling walker;Shower transfer;Tub transfer  ?Plan Discharge plan remains appropriate   ? ?Co-evaluation ? ? ?   ?  ?  ?  ?  ? ?  ?AM-PAC OT "6 Clicks" Daily Activity     ?Outcome Measure ? ? Help from another person eating meals?: A Lot ?Help from another person taking care of personal grooming?: A Lot ?Help from another person toileting, which includes using toliet, bedpan, or urinal?: A Lot ?Help from another person bathing (including washing, rinsing, drying)?: A Lot ?Help from another person to put on and taking off regular upper body clothing?: A Lot ?Help from another person to put on and taking off regular lower body clothing?: A Lot ?6 Click Score: 12 ? ?  ?End of Session Equipment Utilized During Treatment: Gait belt;Rolling walker (2 wheels) ? ?OT Visit  Diagnosis: Unsteadiness on feet (R26.81);Muscle weakness (generalized) (M62.81);Cognitive communication deficit (R41.841) ?  ?Activity Tolerance Patient tolerated treatment well ?  ?Patient Left in bed;with call bell/phone within reach;with bed alarm set ?  ?Nurse Communication Mobility status ?  ? ?   ? ?Time: 6073-7106 ?OT Time Calculation (min): 24 min ? ?Charges: OT General Charges ?$OT Visit: 1 Visit ?OT Treatments ?$Self Care/Home Management : 23-37 mins ? ?Alfonse Flavors, OTA ?Acute Rehabilitation Services  ?Pager [phone] ?Office [phone] ? ? ?Huan Pollok Jeannett Senior ?04/13/2021, 10:35 AM ?

## 2021-04-13 NOTE — Progress Notes (Signed)
Physical Therapy Treatment ?Patient Details ?Name: Jermaine Nixon ?MRN: [id number] ?DOB: [birth year] ?Today's Date: 04/13/2021 ? ? ?History of Present Illness 76 y/o male presented to ED on 04/06/21 for weakness, hypotension, and shaking. Admitted for AKI with lactic acidosis and dehydration. CXR negative. S/p EGD with foreign body removal on 4/7. PMH: T2DM, mental disability, BPH, HTN ? ?  ?PT Comments  ? ? Continuing work on functional mobility and activity tolerance;  session focused on functional transfers, using stedy for assist with the hopes of having the bar in front of him to perhaps decr fear of falling forward; Posterior lean persisted, but pt was able to stand with more anterior weight shift within the frame of the stedy; 2 person assist to stand; noted plan for going to post-acute rehab today  ?Recommendations for follow up therapy are one component of a multi-disciplinary discharge planning process, led by the attending physician.  Recommendations may be updated based on patient status, additional functional criteria and insurance authorization. ? ?Follow Up Recommendations ? Skilled nursing-short term rehab (<3 hours/day) ?  ?  ?Assistance Recommended at Discharge Frequent or constant Supervision/Assistance  ?Patient can return home with the following Two people to help with walking and/or transfers;Two people to help with bathing/dressing/bathroom ?  ?Equipment Recommendations ? Rolling walker (2 wheels);BSC/3in1;Wheelchair (measurements PT);Wheelchair cushion (measurements PT)  ?  ?Recommendations for Other Services   ? ? ?  ?Precautions / Restrictions Precautions ?Precautions: Fall ?Precaution Comments: cognitive disability  ?  ? ?Mobility ? Bed Mobility ?Overal bed mobility: Needs Assistance ?Bed Mobility: Supine to Sit ?  ?  ?Supine to sit: Min guard ?  ?  ?General bed mobility comments: slow moving, and inefficient scooting; mingaurd for safety, but no need for physical asssit ?  ? ?Transfers ?Overall  transfer level: Needs assistance ?Equipment used: Ambulation equipment used ?Transfers: Sit to/from Stand, Bed to chair/wheelchair/BSC ?Sit to Stand: Mod assist ?  ?  ?  ?  ?  ?General transfer comment: posterior leaning during transfer, but able to pull on stedy bar for a bit more anterior lean ?Transfer via Lift Equipment: Stedy ? ?Ambulation/Gait ?  ?  ?  ?  ?  ?  ?  ?  ? ? ?Stairs ?  ?  ?  ?  ?  ? ? ?Wheelchair Mobility ?  ? ?Modified Rankin (Stroke Patients Only) ?  ? ? ?  ?Balance   ?  ?Sitting balance-Leahy Scale: Fair ?  ?  ?  ?Standing balance-Leahy Scale: Poor ?Standing balance comment: stood within stedy at sink to wash hands; multimodal cueing and encouragemnt to reach for soap/paper towel, etc ?  ?  ?  ?  ?  ?  ?  ?  ?  ?  ?  ?  ? ?  ?Cognition Arousal/Alertness: Awake/alert ?Behavior During Therapy: [facility] for tasks assessed/performed ?Overall Cognitive Status: History of cognitive impairments - at baseline ?  ?  ?  ?  ?  ?  ?  ?  ?  ?  ?  ?  ?  ?  ?  ?  ?General Comments: expressed needs with nodding and pointing ?  ?  ? ?  ?Exercises   ? ?  ?General Comments General comments (skin integrity, edema, etc.): Sister present and helpful during a portion of session ?  ?  ? ?Pertinent Vitals/Pain Pain Assessment ?Pain Assessment: No/denies pain  ? ? ?Home Living   ?  ?  ?  ?  ?  ?  ?  ?  ?  ?   ?  ?  Prior Function    ?  ?  ?   ? ?PT Goals (current goals can now be found in the care plan section) Acute Rehab PT Goals ?Patient Stated Goal: agreed to get up ?PT Goal Formulation: Patient unable to participate in goal setting ?Time For Goal Achievement: 04/23/21 ?Potential to Achieve Goals: Fair ?Progress towards PT goals: Progressing toward goals ? ?  ?Frequency ? ? ? Min 3X/week ? ? ? ?  ?PT Plan Current plan remains appropriate  ? ? ?Co-evaluation   ?  ?  ?  ?  ? ?  ?AM-PAC PT "6 Clicks" Mobility   ?Outcome Measure ? Help needed turning from your back to your side while in a flat bed without using bedrails?: A  Little ?Help needed moving from lying on your back to sitting on the side of a flat bed without using bedrails?: A Little ?Help needed moving to and from a bed to a chair (including a wheelchair)?: A Lot ?Help needed standing up from a chair using your arms (e.g., wheelchair or bedside chair)?: Total ?Help needed to walk in hospital room?: Total ?Help needed climbing 3-5 steps with a railing? : Total ?6 Click Score: 11 ? ?  ?End of Session Equipment Utilized During Treatment: Gait belt ?Activity Tolerance: Patient tolerated treatment well ?Patient left: in chair;with call bell/phone within reach;with chair alarm set ?Nurse Communication: Mobility status ?PT Visit Diagnosis: Muscle weakness (generalized) (M62.81) ?  ? ? ?Time: 1751-0258 ?PT Time Calculation (min) (ACUTE ONLY): 19 min ? ?Charges:  $Therapeutic Activity: 8-22 mins          ?          ? ?Van Clines, PT  ?Acute Rehabilitation Services ?Office [phone] ? ? ? ?Levi Aland ?04/13/2021, 2:14 PM ? ?

## 2021-05-04 ENCOUNTER — Ambulatory Visit (INDEPENDENT_AMBULATORY_CARE_PROVIDER_SITE_OTHER): Payer: Medicare Other | Admitting: Physician Assistant

## 2021-05-04 ENCOUNTER — Other Ambulatory Visit (INDEPENDENT_AMBULATORY_CARE_PROVIDER_SITE_OTHER): Payer: Medicare Other

## 2021-05-04 ENCOUNTER — Encounter: Payer: Self-pay | Admitting: Physician Assistant

## 2021-05-04 VITALS — BP 126/82 | HR 84 | Wt 120.0 lb

## 2021-05-04 DIAGNOSIS — D649 Anemia, unspecified: Secondary | ICD-10-CM | POA: Diagnosis not present

## 2021-05-04 LAB — CBC WITH DIFFERENTIAL/PLATELET
Basophils Absolute: 0.1 10*3/uL (ref 0.0–0.1)
Basophils Relative: 0.9 % (ref 0.0–3.0)
Eosinophils Absolute: 0.2 10*3/uL (ref 0.0–0.7)
Eosinophils Relative: 3.3 % (ref 0.0–5.0)
HCT: 35.1 % — ABNORMAL LOW (ref 39.0–52.0)
Hemoglobin: 11.4 g/dL — ABNORMAL LOW (ref 13.0–17.0)
Lymphocytes Relative: 32.9 % (ref 12.0–46.0)
Lymphs Abs: 1.9 10*3/uL (ref 0.7–4.0)
MCHC: 32.5 g/dL (ref 30.0–36.0)
MCV: 91.1 fl (ref 78.0–100.0)
Monocytes Absolute: 0.5 10*3/uL (ref 0.1–1.0)
Monocytes Relative: 8.8 % (ref 3.0–12.0)
Neutro Abs: 3.1 10*3/uL (ref 1.4–7.7)
Neutrophils Relative %: 54.1 % (ref 43.0–77.0)
Platelets: 300 10*3/uL (ref 150.0–400.0)
RBC: 3.86 Mil/uL — ABNORMAL LOW (ref 4.22–5.81)
RDW: 16.2 % — ABNORMAL HIGH (ref 11.5–15.5)
WBC: 5.7 10*3/uL (ref 4.0–10.5)

## 2021-05-04 NOTE — Progress Notes (Signed)
? ?Chief Complaint: Blood in stool? ? ?HPI: ?   [name] is a 76 year old male, assigned to Dr. Havery Moros at recent hospitalization, with a past medical history of a mental disability and diabetes, who presents clinic today with a complaint of blood in stool. ?   04/06/2021 CT the abdomen pelvis with contrast with no acute findings, moderate stool burden. ?   04/07/2021 patient consulted on in the hospital for anemia.  Hemoglobin had decreased from 12.1 on 02/10/2021--> 10--> 9.8.  Family wanted to proceed with EGD and colonoscopy.  Patient was started on Pantoprazole 40 mg twice a day in case of upper GI source.  He was set up for an EGD and colonoscopy the next day. ?   04/08/21 patient underwent EGD with Dr. Collene Mares with normal-appearing, widely patent esophagus and GE junction, large foreign body found in the stomach which look like plastic rolled up in a ball as well as some bleeding from the esophagus while pulling the foreign body out.  These findings were discussed with the POA and they agreed on no further intervention.  Colonoscopy was attempted on the same day but patient was completely unable to prep and had to be aborted. ?   04/13/2021 hemoglobin 10.3. ?   Today, the patient presents from his living facility and his sister met him here who is also his POA.  She explains that she is not sure why he is here they just called her from the facility to tell her that he had an appointment.  She is unaware of anything abnormal going on and tells me that he has a really good appetite and is doing well and is having regular bowel movements.  She denies any blood in the stool.  Tells me nothing has changed on there and they do not wish for him to have any further procedures. ?   Denies fever, chills, weight loss or change in bowel habits. ? ?Past Medical History:  ?Diagnosis Date  ? Diabetes mellitus without complication ([place])   ? Hypertension   ? Mental disability   ? ? ?Past Surgical History:  ?Procedure Laterality Date   ? ESOPHAGOGASTRODUODENOSCOPY (EGD) WITH PROPOFOL N/A 04/08/2021  ? Procedure: ESOPHAGOGASTRODUODENOSCOPY (EGD) WITH PROPOFOL;  Surgeon: Juanita Craver, MD;  Location: [facility] ENDOSCOPY;  Service: Gastroenterology;  Laterality: N/A;  ? FLEXIBLE SIGMOIDOSCOPY N/A 04/08/2021  ? Procedure: FLEXIBLE SIGMOIDOSCOPY;  Surgeon: Juanita Craver, MD;  Location: [facility] ENDOSCOPY;  Service: Gastroenterology;  Laterality: N/A;  ? FOREIGN BODY REMOVAL  04/08/2021  ? Procedure: FOREIGN BODY REMOVAL;  Surgeon: Juanita Craver, MD;  Location: [facility] ENDOSCOPY;  Service: Gastroenterology;;  ? ? ?Current Outpatient Medications  ?Medication Sig Dispense Refill  ? amLODipine (NORVASC) 10 MG tablet Take 1 tablet (10 mg total) by mouth daily.    ? aspirin EC 81 MG tablet Take 81 mg by mouth daily.    ? Assure Comfort Lancets 28G MISC Frequency:   Dosage:0     Instructions:  Note:check glucose daily    ? atorvastatin (LIPITOR) 40 MG tablet Take 40 mg by mouth daily.    ? Blood Glucose Monitoring Suppl (FIFTY50 GLUCOSE METER 2.0) w/Device KIT Frequency:   Dosage:0     Instructions:  Note:check blood sugar daily    ? docusate sodium (COLACE) 100 MG capsule Take 1 capsule (100 mg total) by mouth daily. 10 capsule 0  ? glucose blood (PRODIGY NO CODING BLOOD GLUC) test strip Frequency:Daily   Dosage:1     Instructions:  Note:TEST ONCE  DAILY.    ? insulin detemir (LEVEMIR) 100 UNIT/ML injection Inject 0.12 mLs (12 Units total) into the skin daily. 10 mL 0  ? insulin lispro (HUMALOG KWIKPEN) 100 UNIT/ML KwikPen Before each meal 3 times a day, 140-199 - 2 units, 200-250 - 4 units, 251-299 - 6 units,  300-349 - 8 units,  350 or above 10 units. 15 mL 0  ? JARDIANCE 10 MG TABS tablet Take 1 tablet (10 mg total) by mouth daily. Hold for now, please follow up with your pcp to discuss resumption  this medication (Patient taking differently: Take 10 mg by mouth daily.) 30 tablet   ? metFORMIN (GLUCOPHAGE) 500 MG tablet Take 500 mg by mouth 2 (two) times daily with a meal.    ?  pantoprazole (PROTONIX) 40 MG tablet Take 1 tablet (40 mg total) by mouth 2 (two) times daily before a meal.    ? polyethylene glycol (MIRALAX / GLYCOLAX) 17 g packet Take 17 g by mouth daily as needed. 14 each 0  ? tamsulosin (FLOMAX) 0.4 MG CAPS capsule Take 0.4 mg by mouth daily after breakfast.    ? ?No current facility-administered medications for this visit.  ? ? ?Allergies as of 05/04/2021  ? (No Known Allergies)  ? ? ?Family History  ?Problem Relation Age of Onset  ? Hypertension Other   ? Diabetes Other   ? ? ?Social History  ? ?Socioeconomic History  ? Marital status: Single  ?  Spouse name: Not on file  ? Number of children: Not on file  ? Years of education: Not on file  ? Highest education level: Not on file  ?Occupational History  ? Not on file  ?Tobacco Use  ? Smoking status: Never  ? Smokeless tobacco: Never  ?Vaping Use  ? Vaping Use: Never used  ?Substance and Sexual Activity  ? Alcohol use: Never  ? Drug use: Never  ? Sexual activity: Not on file  ?Other Topics Concern  ? Not on file  ?Social History Narrative  ? Not on file  ? ?Social Determinants of Health  ? ?Financial Resource Strain: Not on file  ?Food Insecurity: Not on file  ?Transportation Needs: Not on file  ?Physical Activity: Not on file  ?Stress: Not on file  ?Social Connections: Not on file  ?Intimate Partner Violence: Not on file  ? ? ?Review of Systems:    ?Constitutional: No fever or chills ?Cardiovascular: No chest pain  ?Respiratory: No SOB  ?Gastrointestinal: See HPI and otherwise negative ? ? Physical Exam:  ?Vital signs: ?BP 126/82   Pulse 84   Wt 120 lb (54.4 kg)   SpO2 94%   BMI 16.27 kg/m?   ? ?Constitutional:   Pleasant thin appearing AA male appears to be in NAD, Well developed, Well nourished, alert and cooperative ?Respiratory: Respirations even and unlabored. Lungs clear to auscultation bilaterally.   No wheezes, crackles, or rhonchi.  ?Cardiovascular: Normal S1, S2. No MRG. Regular rate and rhythm. No peripheral  edema, cyanosis or pallor.  ?Gastrointestinal:  Soft, nondistended, nontender. No rebound or guarding. Normal bowel sounds. No appreciable masses or hepatomegaly. ?Rectal:  Not performed.  ?Psychiatric: +mental disability, unable to answer questioning ? ?RELEVANT LABS AND IMAGING: ?CBC ?   ?Component Value Date/Time  ? WBC 6.5 04/13/2021 0248  ? RBC 3.39 (L) 04/13/2021 0248  ? HGB 10.3 (L) 04/13/2021 0248  ? HCT 30.3 (L) 04/13/2021 0248  ? PLT 231 04/13/2021 0248  ? MCV 89.4 04/13/2021 0248  ?  MCH 30.4 04/13/2021 0248  ? MCHC 34.0 04/13/2021 0248  ? RDW 15.8 (H) 04/13/2021 0248  ? LYMPHSABS 1.7 04/13/2021 0248  ? MONOABS 0.5 04/13/2021 0248  ? EOSABS 0.1 04/13/2021 0248  ? BASOSABS 0.0 04/13/2021 0248  ? ? ?CMP  ?   ?Component Value Date/Time  ? NA 136 04/13/2021 0248  ? K 4.3 04/13/2021 0248  ? CL 105 04/13/2021 0248  ? CO2 25 04/13/2021 0248  ? GLUCOSE 125 (H) 04/13/2021 0248  ? BUN 14 04/13/2021 0248  ? CREATININE 0.86 04/13/2021 0248  ? CALCIUM 9.3 04/13/2021 0248  ? PROT 5.9 (L) 04/13/2021 0248  ? ALBUMIN 2.9 (L) 04/13/2021 0248  ? AST 29 04/13/2021 0248  ? ALT 23 04/13/2021 0248  ? ALKPHOS 50 04/13/2021 0248  ? BILITOT 0.5 04/13/2021 0248  ? GFRNONAA >60 04/13/2021 0248  ? ? ?Assessment: ?1.  Anemia: Recently in the hospital with evaluation for anemia with EGD showing a foreign body in the stomach which was removed and a flex sig which was aborted due to poor prep, family does not want any further intervention; consider GI source versus other ? ?Plan: ?1.  We will recheck a CBC while the patient is here with [place] as well as iron studies. ?2.  If patient remains anemic would recommend starting an iron supplement, could also consider referral to hematology for work-up on their end as family is not wanting to pursue any further GI work-up/procedures ?3.  Patient to follow with [place] as needed. ? ?Ellouise Newer, PA-C ?[facility] ?05/04/2021, 2:44 PM ? ?Cc: Saintclair Halsted, FNP  ?

## 2021-05-04 NOTE — Patient Instructions (Signed)
If you are age 78 or older, your body mass index should be between 23-30. Your Body mass index is 16.27 kg/m?Marland Kitchen If this is out of the aforementioned range listed, please consider follow up with your Primary Care Provider. ? ?If you are age 25 or younger, your body mass index should be between 19-25. Your Body mass index is 16.27 kg/m?Marland Kitchen If this is out of the aformentioned range listed, please consider follow up with your Primary Care Provider.  ? ?Your provider has requested that you go to the basement level for lab work before leaving today. Press "B" on the elevator. The lab is located at the first door on the left as you exit the elevator. ? ? ?The [facility] providers would like to encourage you to use [facility] to communicate with providers for non-urgent requests or questions.  Due to long hold times on the telephone, sending your provider a message by [facility] may be a faster and more efficient way to get a response.  Please allow 48 business hours for a response.  Please remember that this is for non-urgent requests.  ? ?Due to recent changes in healthcare laws, you may see the results of your imaging and laboratory studies on MyChart before your provider has had a chance to review them.  We understand that in some cases there may be results that are confusing or concerning to you. Not all laboratory results come back in the same time frame and the provider may be waiting for multiple results in order to interpret others.  Please give [place] 48 hours in order for your provider to thoroughly review all the results before contacting the office for clarification of your results.  ? ?It was a pleasure to see you today! ? ?Thank you for trusting me with your gastrointestinal care!   ? ?Ellouise Newer, PA-C ? ?

## 2021-05-05 ENCOUNTER — Other Ambulatory Visit: Payer: Self-pay

## 2021-05-05 DIAGNOSIS — D509 Iron deficiency anemia, unspecified: Secondary | ICD-10-CM

## 2021-05-05 LAB — IRON,TIBC AND FERRITIN PANEL
%SAT: 12 % (calc) — ABNORMAL LOW (ref 20–48)
Ferritin: 20 ng/mL — ABNORMAL LOW (ref 24–380)
Iron: 42 ug/dL — ABNORMAL LOW (ref 50–180)
TIBC: 363 mcg/dL (calc) (ref 250–425)

## 2021-05-05 MED ORDER — FERROUS SULFATE 325 (65 FE) MG PO TBEC: 325.0000 mg | DELAYED_RELEASE_TABLET | Freq: Every day | ORAL | 0 refills | Status: AC

## 2021-05-05 NOTE — Progress Notes (Signed)
Agree with assessment and plan as outlined.  

## 2021-07-02 DEATH — deceased

## 2021-07-04 ENCOUNTER — Telehealth: Payer: Self-pay

## 2021-07-04 NOTE — Telephone Encounter (Signed)
Lm on Jermaine Nixon's vm asking that she give me a call back regarding pt.

## 2021-07-04 NOTE — Telephone Encounter (Signed)
-----   Message from Missy Sabins, RN sent at 05/05/2021  9:10 AM EDT ----- Regarding: Labs IBC + Ferritin - orders are in epic Contact pt's sister

## 2021-07-07 NOTE — Telephone Encounter (Signed)
Called and spoke with [name]. I could barely hear her. She states that she [place] right now and will give me a call back.

## 2021-07-08 NOTE — Telephone Encounter (Signed)
Lm own Gloria's vm asking that she give me a call back regarding pt.

## 2021-07-11 NOTE — Telephone Encounter (Signed)
Left Jermaine Nixon a detailed vm with lab reminder for patient. I have also mailed a letter.

## 2024-03-17 IMAGING — CT CT ANGIO CHEST
2 of 6 series · 18 of 36 positions shown · IV contrast (agent unspecified)
Comparison: None.

CLINICAL DATA: Positive D-dimer

EXAM:
CT ANGIOGRAPHY CHEST WITH CONTRAST
TECHNIQUE: Multidetector CT imaging of the chest was performed using the
standard protocol during bolus administration of intravenous
contrast. Multiplanar CT image reconstructions and MIPs were
obtained to evaluate the vascular anatomy.

[Series 7: pe thins · axial · 0.81mm/px · z∈[+1216,+1476]mm · 17 of 414 slices shown]
[im 21/414  lung]
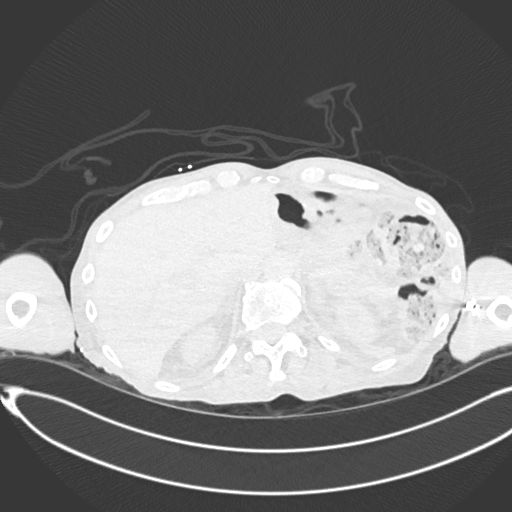
[im 42/414  mediastinal]
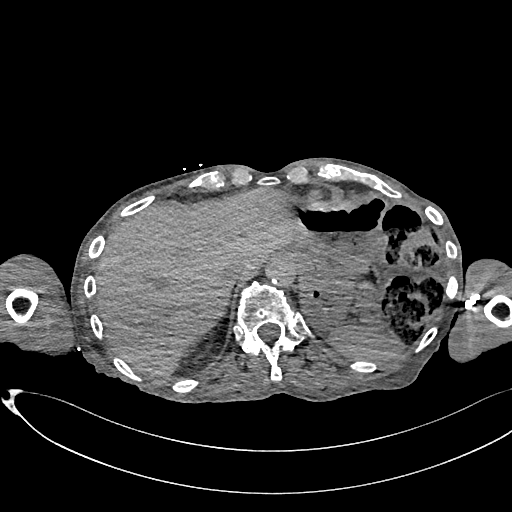
[im 62/414  lung]
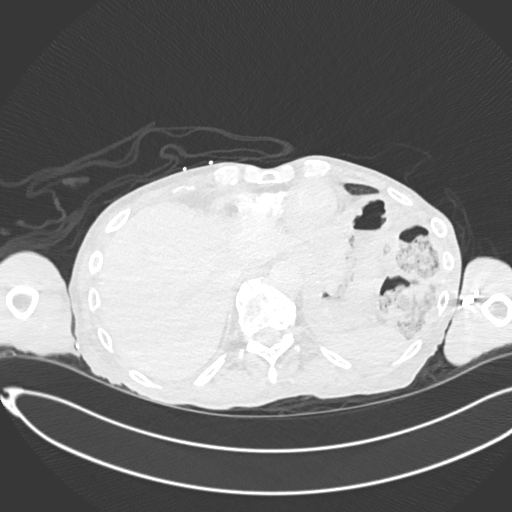
[im 83/414  mediastinal]
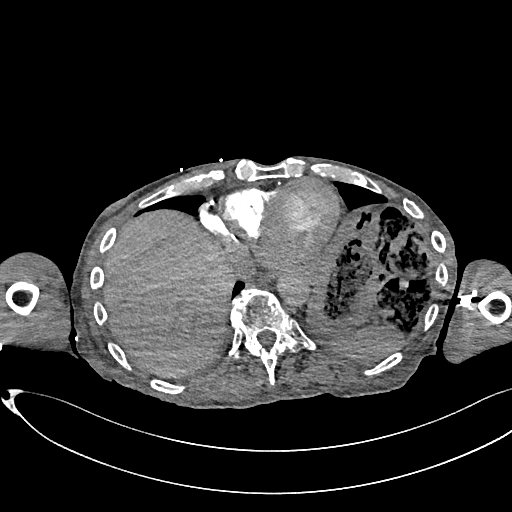
[im 124/414  lung]
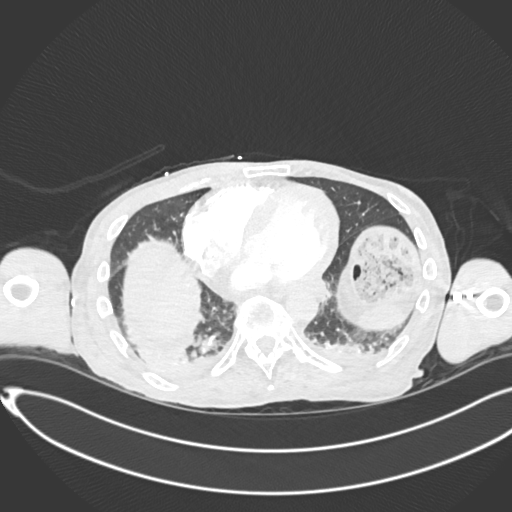
[im 145/414  mediastinal]
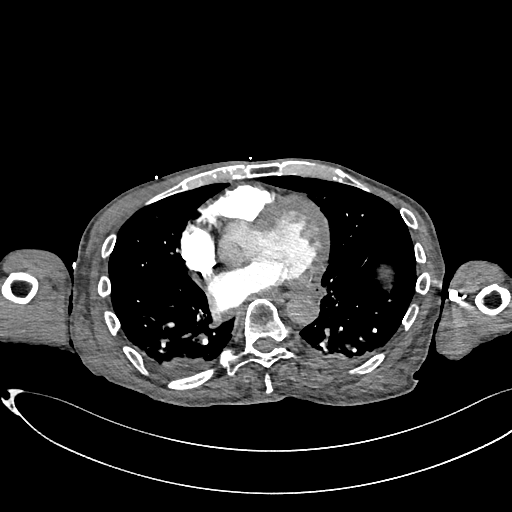
[im 166/414  lung]
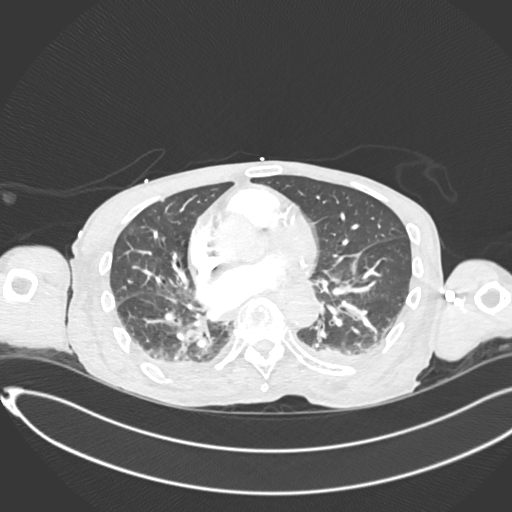
[im 186/414  mediastinal]
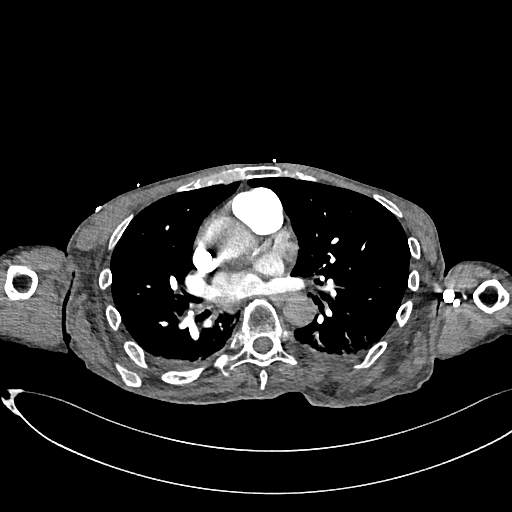
[im 207/414  lung]
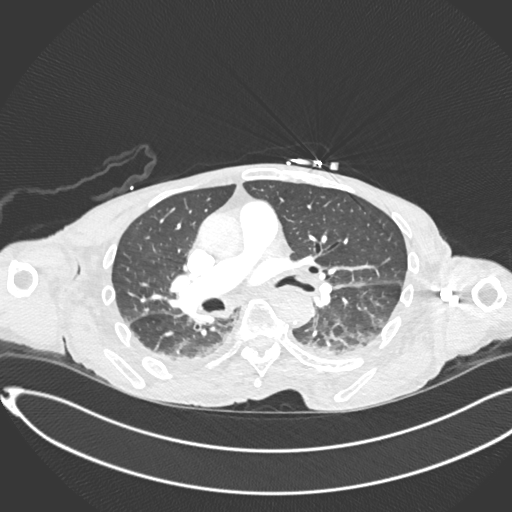
[im 228/414  mediastinal]
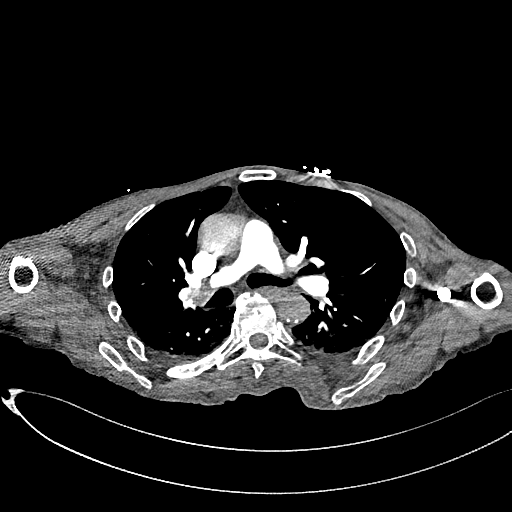
[im 248/414  lung]
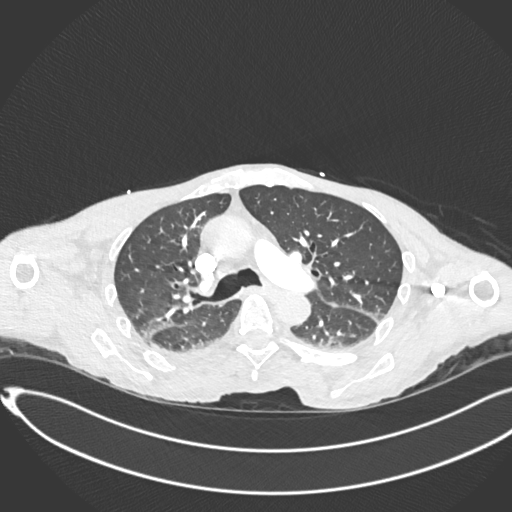
[im 269/414  mediastinal]
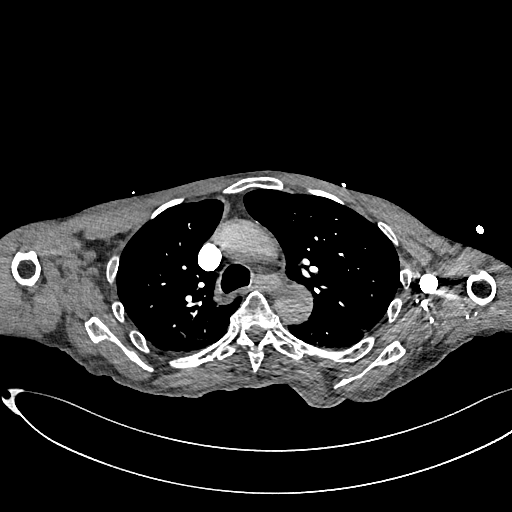
[im 290/414  lung]
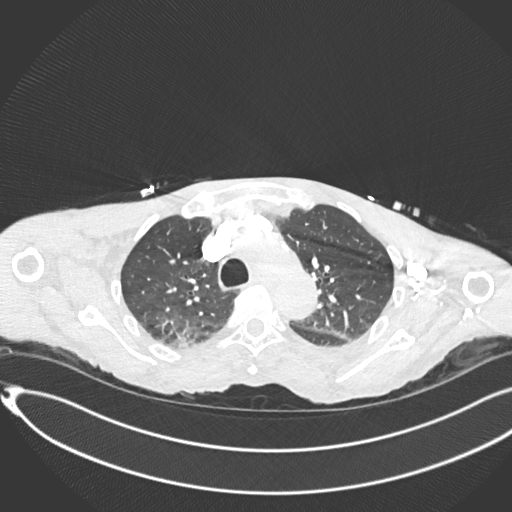
[im 331/414  mediastinal]
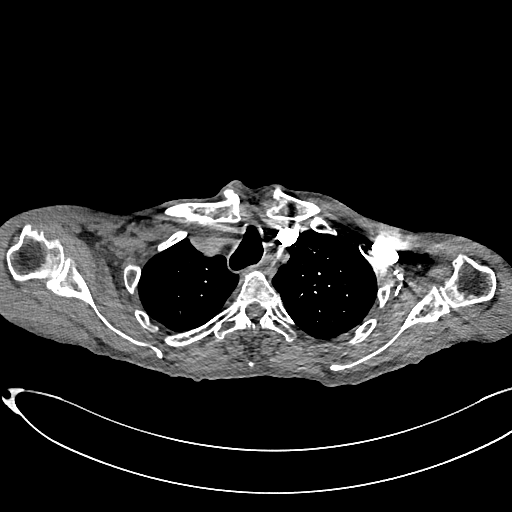
[im 352/414  lung]
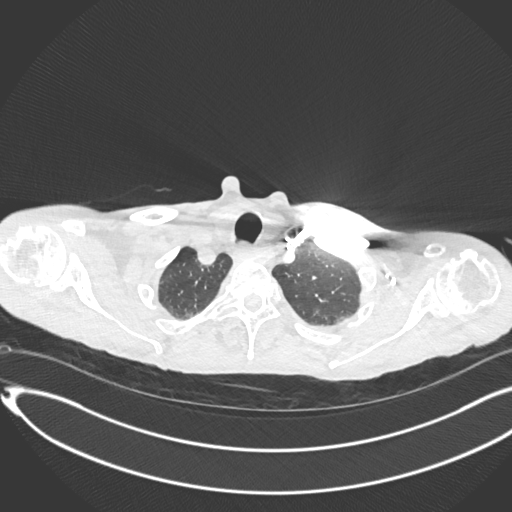
[im 372/414  mediastinal]
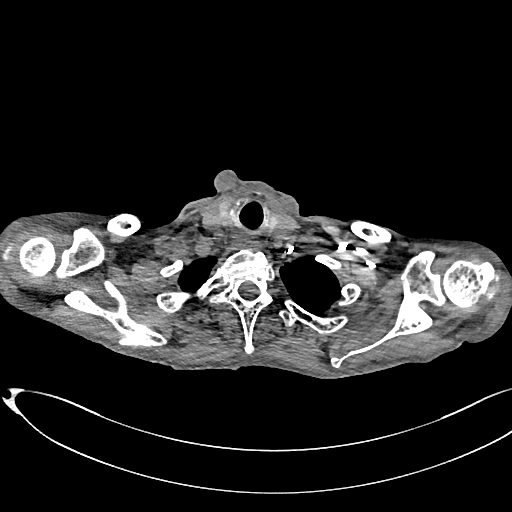
[im 393/414  lung]
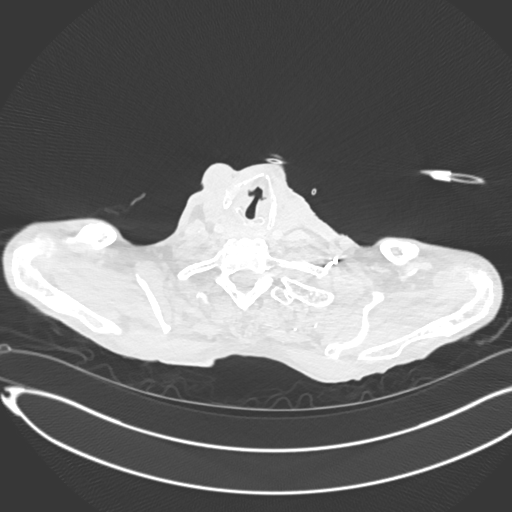

[Series 8: pe 2mm cor · coronal · 0.59mm/px · 1 of 100 slices shown]
[im 50/100  mediastinal]
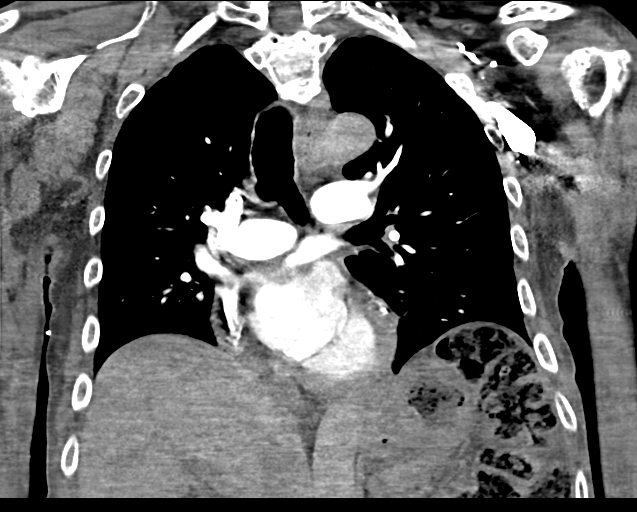

[18 of 36 positions shown; findings below may reference images not displayed]

RADIATION DOSE REDUCTION: This exam was performed according to the
departmental dose-optimization program which includes automated
exposure control, adjustment of the mA and/or kV according to
patient size and/or use of iterative reconstruction technique.

CONTRAST:  100mL OMNIPAQUE IOHEXOL 350 MG/ML SOLN
FINDINGS: Cardiovascular: Coronary artery calcifications are seen. Heart is
enlarged in size. Small pericardial effusion is present. Contrast
density in the thoracic aorta is less than adequate to evaluate the
lumen. There is ectasia of ascending thoracic aorta and left side of
aortic arch. There are no intraluminal filling defects in the
pulmonary artery branches.

Mediastinum/Nodes: No significant lymphadenopathy seen.

Lungs/Pleura: There are patchy infiltrates in the posterior lower
lung fields. Small bilateral pleural effusions are seen. There is no
pneumothorax.

Upper Abdomen: Unremarkable.

Musculoskeletal: There is partial fusion of some of the lower
thoracic vertebral bodies. There is encroachment of neural foramina
by bony spurs in the lower cervical spine and lower thoracic spine.

Review of the MIP images confirms the above findings.
IMPRESSION: There is no evidence of pulmonary artery embolism. Coronary artery
calcifications are seen.

There are patchy infiltrates in the posterior lower lung fields
suggesting atelectasis/pneumonia. Small bilateral pleural effusions
are seen.

Other findings as described in the body of the report.
